# Patient Record
Sex: Female | Born: 1995 | Hispanic: Yes | Marital: Married | Smoking: Never smoker
Health system: Southern US, Community
[De-identification: ages and names within clinical notes are randomized; demographics above are authoritative.]

## PROBLEM LIST (undated history)

## (undated) DIAGNOSIS — F419 Anxiety disorder, unspecified: Secondary | ICD-10-CM

## (undated) DIAGNOSIS — Z789 Other specified health status: Secondary | ICD-10-CM

## (undated) DIAGNOSIS — J45909 Unspecified asthma, uncomplicated: Secondary | ICD-10-CM

## (undated) DIAGNOSIS — E079 Disorder of thyroid, unspecified: Secondary | ICD-10-CM

## (undated) HISTORY — PX: NO PAST SURGERIES: SHX2092

## (undated) HISTORY — PX: THYROIDECTOMY: SHX17

## (undated) HISTORY — PX: TONSILLECTOMY: SUR1361

---
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
## 2016-01-28 ENCOUNTER — Encounter (INDEPENDENT_AMBULATORY_CARE_PROVIDER_SITE_OTHER): Payer: Self-pay | Admitting: Pediatric Endocrinology

## 2016-01-28 ENCOUNTER — Ambulatory Visit (INDEPENDENT_AMBULATORY_CARE_PROVIDER_SITE_OTHER): Payer: Medicaid Other | Admitting: Pediatric Endocrinology

## 2016-01-28 VITALS — BP 122/63 | HR 68 | Wt 193.0 lb

## 2016-01-28 DIAGNOSIS — E890 Postprocedural hypothyroidism: Secondary | ICD-10-CM

## 2016-01-28 LAB — COMPREHENSIVE METABOLIC PANEL
ALBUMIN: 4.2 g/dL (ref 3.6–5.1)
ALK PHOS: 40 U/L — AB (ref 47–176)
ALT: 11 U/L (ref 5–32)
AST: 13 U/L (ref 12–32)
BUN: 16 mg/dL (ref 7–20)
CALCIUM: 9.3 mg/dL (ref 8.9–10.4)
CHLORIDE: 105 mmol/L (ref 98–110)
CO2: 28 mmol/L (ref 20–31)
Creat: 0.71 mg/dL (ref 0.50–1.00)
Glucose, Bld: 76 mg/dL (ref 70–99)
POTASSIUM: 4.4 mmol/L (ref 3.8–5.1)
Sodium: 140 mmol/L (ref 135–146)
TOTAL PROTEIN: 6.8 g/dL (ref 6.3–8.2)
Total Bilirubin: 0.2 mg/dL (ref 0.2–1.1)

## 2016-01-28 LAB — T4, FREE: Free T4: 1.2 ng/dL (ref 0.8–1.4)

## 2016-01-28 LAB — TSH: TSH: 4.43 mIU/L — ABNORMAL HIGH (ref 0.50–4.30)

## 2016-01-28 LAB — T3, FREE: T3, Free: 2.6 pg/mL — ABNORMAL LOW (ref 3.0–4.7)

## 2016-01-28 NOTE — Progress Notes (Signed)
Subjective:  Subjective  Patient Name: Bertice Risse Date of Birth: 11-14-1995  MRN: [id number]  Emmajean Ratledge  presents to the office today for follow up evaluation and management of her hyperthyroidism secondary to Graves' disease and her noncompliance with anti-thyroid medications.[name]  HISTORY OF PRESENT ILLNESS:   Jazzmin is a 20 y.o. Hispanic-American young lady.   Saidee was accompanied by her self   1. The patient's initial pediatric endocrine consultation occurred on 07/09/13. On 06/13/13 the patient visited [facility] for a routine exam. Dr. Sabino Dick noted a goiter. TFTS showed a TSH of < 0.008 and free T4 of 10.31. She had symptoms consistent with hyperthyroidism including fatigue, poor sleep quality, weight loss, and tremor. She was then referred to endocrinology for further evaluation and management.   She had her thyroid removed at [facility] on 02/18/15.   2. The patient's last [facility] visit was on 09/25/15. In the interim she has been generally healthy.   She spent the summer in [place]. It was very hot.   She feels that she is doing really well now. She has been taking her synthroid basically every day. She is working out regularly. She feels that her hair is finally growing in nicely and her skin is also more healthy. She feels good and she is even losing weight.   She is on Synthroid 1 tab daily.  She says that her pills are BLUE. Her last note says 175 mcg which is a lavender pill. Her med rec shows 100 mcg which is a yellow pill. She did no bring the bottle with her today. She thinks it is the 175 mcg tabs.   She is taking Tums and Calcitriol.    3. Constitutional.  She feels "better/great" She appears well today.  Energy: She reports that she has more energy. Sleep: The patient is sleeping better. - she says she is sleeping more hours but waking up earlier and with more energy Body temperature: The patient's body temperature seems to be normal overall. She thinks it is normal.   Weight: Weight is down somewhat from last visit.  Eyes: There are no significant problems with soreness, bulging, or limited range of eye movements. Wears glasses Neck: The patient is not aware of any problems relating to the anterior neck and thyroid bed. She has a scar which is healing well Heart: The patient feels that her heart rate is fairly normal now.  Gastrointestinal:  There are no significant complaints of excessive hunger, acid reflux, upset stomach, stomach aches or pains, or constipation. Musculoskeletal: Muscles are getting stronger.  Psychological: Moods have improved- No more mood swings GYN: periods more regular. LMP about 2 weeks ago.   PAST MEDICAL, FAMILY, AND SOCIAL HISTORY  Past Medical History:  Diagnosis Date  . Asthma   . Thyroid disease     Family History  Problem Relation Age of Onset  . Cancer Maternal Grandmother   . Hypertension Maternal Grandmother   . Diabetes Maternal Grandfather      Current Outpatient Prescriptions:  .  calcium carbonate (TUMS - DOSED IN MG ELEMENTAL CALCIUM) 500 MG chewable tablet, Chew 1 tablet by mouth daily. Reported on 09/25/2015, Disp: , Rfl:  .  Calcium Citrate (CALCITRATE PO), Take by mouth., Disp: , Rfl:  .  levothyroxine (SYNTHROID) 100 MCG tablet, Take 1 tablet (100 mcg total) by mouth daily., Disp: 30 tablet, Rfl: 11  Allergies as of 01/28/2016  . (No Known Allergies)     reports that she has never  smoked. She does not have any smokeless tobacco history on file. She reports that she does not drink alcohol. Pediatric History  Patient Guardian Status  . Mother:  Jeanine Luz   Other Topics Concern  . Not on file   Social History Narrative  . No narrative on file    1. 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 Activities: working out in the mornings.  3. Primary Care Provider: Melanie Crazier, NP Has an appointment with Dr. Lorrin Jackson in 2 weeks.   REVIEW OF SYSTEMS: There are no other  significant problems involving Kolina's other body systems.    Objective:  Objective  Vital Signs:  BP 122/63   Pulse 68   Wt 193 lb (87.5 kg)   No height on file for this encounter.  Ht Readings from Last 3 Encounters:  01/15/15 5' 1.26" (1.556 m) (12 %, Z= -1.16)*  12/20/14 5' 1.54" (1.563 m) (15 %, Z= -1.05)*  10/24/14 5' 1.5" (1.562 m) (14 %, Z= -1.06)*   * Growth percentiles are based on CDC 2-20 Years data.   Wt Readings from Last 3 Encounters:  01/28/16 193 lb (87.5 kg) (97 %, Z= 1.85)*  09/25/15 195 lb 3.2 oz (88.5 kg) (97 %, Z= 1.90)*  03/20/15 185 lb (83.9 kg) (96 %, Z= 1.76)*   * Growth percentiles are based on CDC 2-20 Years data.   HC Readings from Last 3 Encounters:  No data found for [facility]   There is no height or weight on file to calculate BSA. No height on file for this encounter. 97 %ile (Z= 1.85) based on CDC 2-20 Years weight-for-age data using vitals from 01/28/2016.    PHYSICAL EXAM:  Constitutional: The patient appears healthy, but obese. She is calm and not anxious today. Head: The head is normocephalic. Face: The face appears normal. There are no obvious dysmorphic features. Eyes: The eyes appear to be normally formed and spaced. Gaze is conjugate.  There is no obvious arcus or proptosis. Moisture appears normal.  Ears: The ears are normally placed and appear externally normal. Mouth: The oropharynx and tongue appear normal. No more tongue tremor. Dentition appears to be normal for age. Oral moisture is normal.  Neck: Surgical scar noted. She has trace acanthosis nigricans posteriorly. Lungs: The lungs are clear to auscultation. Air movement is good. Heart: Heart rate and rhythm are regular. Heart sounds S1 and S2 are normal. Abdomen: The abdomen is enlarged. Bowel sounds are normal. There is no obvious hepatomegaly, splenomegaly, or other mass effect.  Arms: Muscle size and bulk are normal for age. Hands: Phalangeal and metacarpophalangeal joints  are normal. Palmar muscles are normal for age. No tremor Legs: Muscles appear normal for age. No edema is present..  Neurologic: Strength is below normal for age in both the upper and lower extremities. Muscle tone is normal. Sensation to touch is normal in both legs.    LAB DATA:   pending  IMAGING:  Thyroid US 06/26/14: Diffuse thyromegaly with heterogeneity, but no focal nodules   Assessment and Plan:  Assessment  ASSESSMENT: Bethan is a 20 y.o. Hispanic young woman who had thyroidectoy on 02/18/15 for management of uncontrolled Grave's Disease.   She had an interval of poor medication compliance both before and after thyroidectomy. She currently reports good medication compliance and is clinically euthyroid. Will recheck levels today.  She is unsure what her Synthroid dose is and it appears that it is wrong in the Epic EMR. Will have her check  her bottle at home and send message via MyChart with the correct dose.   She also continues on Calcitriol and Calcium supplements.   PLAN:  1. Diagnostic: TFTs, BMP, vit D level today.  2. Therapeutic:Continue Synthroid 175 (per patient recall this is dose from PCP). Continue Vit D and calcium 3. Patient education: Discussed changes since her surgery. Discussed issues with calcium. She is very pleased with the progress that she has made as she has become more consistent in her medication dosing.   4. Follow-up: Return in about 3 months (around 04/29/2016).      Level of Service: This visit lasted in excess of 25  minutes. More than 50% of the visit was devoted to counseling.   Cammie SickleBADIK, Jeffree Cazeau [name], MD

## 2016-01-28 NOTE — Patient Instructions (Signed)
Labs today  I will result your labs via Mychart- when you get the results- message me back with what dose you are taking of each of your medications and I can order your refills.

## 2016-01-29 ENCOUNTER — Encounter (HOSPITAL_COMMUNITY): Payer: Self-pay | Admitting: *Deleted

## 2016-01-29 LAB — VITAMIN D 25 HYDROXY (VIT D DEFICIENCY, FRACTURES): VIT D 25 HYDROXY: 17 ng/mL — AB (ref 30–100)

## 2016-04-30 ENCOUNTER — Ambulatory Visit (INDEPENDENT_AMBULATORY_CARE_PROVIDER_SITE_OTHER): Payer: Medicaid Other | Admitting: Pediatric Endocrinology

## 2016-04-30 ENCOUNTER — Encounter (INDEPENDENT_AMBULATORY_CARE_PROVIDER_SITE_OTHER): Payer: Self-pay | Admitting: Pediatric Endocrinology

## 2016-04-30 VITALS — BP 118/60 | HR 80 | Ht 61.81 in | Wt 184.2 lb

## 2016-04-30 DIAGNOSIS — E559 Vitamin D deficiency, unspecified: Secondary | ICD-10-CM | POA: Diagnosis not present

## 2016-04-30 DIAGNOSIS — E892 Postprocedural hypoparathyroidism: Secondary | ICD-10-CM

## 2016-04-30 LAB — T4, FREE: Free T4: 2.3 ng/dL — ABNORMAL HIGH (ref 0.8–1.4)

## 2016-04-30 LAB — TSH: TSH: 0.11 m[IU]/L — AB (ref 0.50–4.30)

## 2016-04-30 MED ORDER — LEVOTHYROXINE SODIUM 175 MCG PO TABS: 175.0000 ug | ORAL_TABLET | Freq: Every day | ORAL | 11 refills | Status: DC

## 2016-04-30 NOTE — Progress Notes (Signed)
Subjective:  Subjective  Patient Name: Audrey Burch Date of Birth: 05/30/1995  MRN: [id number]  Audrey Burch  presents to the office today for follow up evaluation and management of her hyperthyroidism secondary to Graves' disease and her noncompliance with anti-thyroid medications.[name].  HISTORY OF PRESENT ILLNESS:   [name] is a 21 y.o. Hispanic-American young lady.   [name] was accompanied by her self   1. The patient's initial pediatric endocrine consultation occurred on 07/09/13. On 06/13/13 the patient visited [facility] for a routine exam. Dr. Sabino Dickoccaro noted a goiter. TFTS showed a TSH of < 0.008 and free T4 of 10.31. She had symptoms consistent with hyperthyroidism including fatigue, poor sleep quality, weight loss, and tremor. She was then referred to endocrinology for further evaluation and management.   [facility].   2. The patient's last [facility] visit was on 01/28/16. In the interim she has been generally healthy.   She has continued to do well since last visit.   She has been taking her Synthroid every day. It is 175 mg. She brought the bottle with her today.   She has been taking Tums- twice daily.   She is not taking any Vit d.   She has been drinking a lot more water and has not been as hungry. She has decreased her portion size. She was working out regularly- she had to take a break because her mother was ill- but now she is about to start back in a cardio class.  She feels that her hair is growing in and filling in the spots where she had hair loss. She has had weight loss. She feels that she is sleeping better and her energy level is "pretty high".   3. Constitutional.  She feels "great" She appears well today.  Energy: She reports that she has more energy. Sleep: The patient is sleeping better.  Body temperature: The patient's body temperature seems to be normal overall. She thinks it is normal.  Weight: Weight is down from last  visit.  Eyes: There are no significant problems with soreness, bulging, or limited range of eye movements. Wears glasses Neck: The patient is not aware of any problems relating to the anterior neck and thyroid bed. She has a scar which is healing well Heart: The patient feels that her heart rate is fairly normal now.  Gastrointestinal:  There are no significant complaints of excessive hunger, acid reflux, upset stomach, stomach aches or pains, or constipation. Musculoskeletal: Muscles are getting stronger.  Psychological: Moods have improved- No more mood swings GYN: periods more irregular. Has not had a cycle in 2 months.   PAST MEDICAL, FAMILY, AND SOCIAL HISTORY  Past Medical History:  Diagnosis Date  . Asthma   . Thyroid disease     Family History  Problem Relation Age of Onset  . Cancer Maternal Grandmother   . Hypertension Maternal Grandmother   . Diabetes Maternal Grandfather      Current Outpatient Prescriptions:  .  calcium carbonate (TUMS - DOSED IN MG ELEMENTAL CALCIUM) 500 MG chewable tablet, Chew 1 tablet by mouth daily. Reported on 09/25/2015, Disp: , Rfl:  .  levothyroxine (SYNTHROID, LEVOTHROID) 175 MCG tablet, Take 1 tablet (175 mcg total) by mouth daily., Disp: 30 tablet, Rfl: 11 .  amoxicillin (AMOXIL) 875 MG tablet, Take 1 tablet (875 mg total) by mouth 2 (two) times daily. (Patient not taking: Reported on 04/30/2016), Disp: 14 tablet, Rfl: 0 .  Calcium Citrate (  CALCITRATE PO), Take by mouth., Disp: , Rfl:  .  ondansetron (ZOFRAN ODT) 4 MG disintegrating tablet, Take 1 tablet (4 mg total) by mouth every 8 (eight) hours as needed for nausea or vomiting. (Patient not taking: Reported on 04/30/2016), Disp: 6 tablet, Rfl: 0  Allergies as of 04/30/2016  . (No Known Allergies)     reports that she has never smoked. She does not have any smokeless tobacco history on file. She reports that she has current or past drug history. She reports that she does not drink  alcohol. Pediatric History  Patient Guardian Status  . Mother:  Oros Huerta,Sonia   Other Topics Concern  . Not on file   Social History Narrative   ** Merged History Encounter **        1. School and Family:  Helping mom- she wanted to have a reversal of her tubal ligation and they discovered that she had a tumor that was blocking and needed to remove- not cancer. She is working on getting approval for the surgery.  2. Activities: working out in the mornings.  3. Primary Care Provider: Melanie Crazier, NP   REVIEW OF SYSTEMS: There are no other significant problems involving Oneta's other body systems.    Objective:  Objective  Vital Signs  BP 118/60   Pulse 80   Ht 5' 1.81" (1.57 m)   Wt 184 lb 3.2 oz (83.6 kg)   BMI 33.90 kg/m   Blood pressure percentiles are 82.7 % systolic and 38.0 % diastolic based on NHBPEP's 4th Report.   Ht Readings from Last 3 Encounters:  04/30/16 5' 1.81" (1.57 m) (17 %, Z= -0.97)*  01/15/15 5' 1.26" (1.556 m) (12 %, Z= -1.16)*  12/20/14 5' 1.54" (1.563 m) (15 %, Z= -1.05)*   * Growth percentiles are based on CDC 2-20 Years data.   Wt Readings from Last 3 Encounters:  04/30/16 184 lb 3.2 oz (83.6 kg) (96 %, Z= 1.70)*  01/28/16 193 lb (87.5 kg) (97 %, Z= 1.85)*  09/25/15 195 lb 3.2 oz (88.5 kg) (97 %, Z= 1.90)*   * Growth percentiles are based on CDC 2-20 Years data.   HC Readings from Last 3 Encounters:  No data found for [facility]   Body surface area is 1.91 meters squared. 17 %ile (Z= -0.97) based on CDC 2-20 Years stature-for-age data using vitals from 04/30/2016. 96 %ile (Z= 1.70) based on CDC 2-20 Years weight-for-age data using vitals from 04/30/2016.    PHYSICAL EXAM:  Constitutional: The patient appears healthy. She is calm and not anxious today. She has had good weight loss since last visit.  Head: The head is normocephalic. Face: The face appears normal. There are no obvious dysmorphic features. Eyes: The eyes appear to be normally  formed and spaced. Gaze is conjugate.  There is no obvious arcus or proptosis. Moisture appears normal.  Ears: The ears are normally placed and appear externally normal. Mouth: The oropharynx and tongue appear normal. No more tongue tremor. Dentition appears to be normal for age. Oral moisture is normal.  Neck: Surgical scar noted. She has trace acanthosis nigricans posteriorly. Lungs: The lungs are clear to auscultation. Air movement is good. Heart: Heart rate and rhythm are regular. Heart sounds S1 and S2 are normal. Abdomen: The abdomen is enlarged. Bowel sounds are normal. There is no obvious hepatomegaly, splenomegaly, or other mass effect.  Arms: Muscle size and bulk are normal for age. Hands: Phalangeal and metacarpophalangeal joints are normal. Palmar muscles are  normal for age. No tremor Legs: Muscles appear normal for age. No edema is present..  Neurologic: Strength is below normal for age in both the upper and lower extremities. Muscle tone is normal. Sensation to touch is normal in both legs.    LAB DATA:   Pending   IMAGING:  Thyroid US 06/26/14: Diffuse thyromegaly with heterogeneity, but no focal nodules   Assessment and Plan:  Assessment  ASSESSMENT: Shaelin is a 21 y.o. Hispanic young woman who had thyroidectoy on 02/18/15 for management of uncontrolled Grave's Disease.   She had an interval of poor medication compliance both before and after thyroidectomy. She currently reports good medication compliance and is clinically euthyroid. Will recheck levels today.  She has done well with taking Calcium but has not been consistent with taking Vit d.   She thinks that her Medicaid was renewed but our records do not reflect this. Discussed the low cost prescriptions at [facility] and other pharmacies.   PLAN:  1. Diagnostic: TFTs only today.  2. Therapeutic:Continue Synthroid 175 and calcium. Start vit D 2000 -2500 IU/day.  3. Patient education: Discussed changes since her last  visit. She is very pleased with the progress that she has made as she has become more consistent in her medication dosing.  Reminded her about adding Vit D. Discussed cash pay and prescription costs if she does not currently have insurance.  4. Follow-up: Return in about 6 months (around 10/28/2016).      Level of Service: This visit lasted in excess of 25 minutes. More than 50% of the visit was devoted to counseling.   Dessa Phi, MD

## 2016-04-30 NOTE — Patient Instructions (Signed)
Labs today.   Start vit D 2000-2500 IU/day. [facility] makes a 2500 IU gummy called Berry-D-Licious.   Continue Synthroid 175 mcg. May increase dose depending on lab today.

## 2016-05-01 ENCOUNTER — Other Ambulatory Visit: Payer: Self-pay | Admitting: Pediatric Endocrinology

## 2016-05-01 DIAGNOSIS — E892 Postprocedural hypoparathyroidism: Secondary | ICD-10-CM

## 2016-05-01 MED ORDER — LEVOTHYROXINE SODIUM 150 MCG PO TABS: 150.0000 ug | ORAL_TABLET | Freq: Every day | ORAL | 11 refills | Status: DC

## 2016-05-11 ENCOUNTER — Other Ambulatory Visit (INDEPENDENT_AMBULATORY_CARE_PROVIDER_SITE_OTHER): Payer: Self-pay | Admitting: *Deleted

## 2016-05-11 DIAGNOSIS — E892 Postprocedural hypoparathyroidism: Secondary | ICD-10-CM

## 2016-05-11 MED ORDER — LEVOTHYROXINE SODIUM 150 MCG PO TABS: 150.0000 ug | ORAL_TABLET | Freq: Every day | ORAL | 11 refills | Status: DC

## 2016-11-03 ENCOUNTER — Ambulatory Visit (INDEPENDENT_AMBULATORY_CARE_PROVIDER_SITE_OTHER): Payer: Self-pay | Admitting: Pediatric Endocrinology

## 2016-12-04 ENCOUNTER — Emergency Department (HOSPITAL_COMMUNITY)
Admission: EM | Admit: 2016-12-04 | Discharge: 2016-12-05 | Disposition: A | Discharge status: Home / Self Care | Payer: No Typology Code available for payment source | Attending: Emergency Medicine | Admitting: Emergency Medicine

## 2016-12-04 ENCOUNTER — Emergency Department (HOSPITAL_COMMUNITY): Payer: No Typology Code available for payment source

## 2016-12-04 ENCOUNTER — Encounter (HOSPITAL_COMMUNITY): Payer: Self-pay | Admitting: *Deleted

## 2016-12-04 DIAGNOSIS — Z79899 Other long term (current) drug therapy: Secondary | ICD-10-CM | POA: Insufficient documentation

## 2016-12-04 DIAGNOSIS — Y939 Activity, unspecified: Secondary | ICD-10-CM | POA: Diagnosis not present

## 2016-12-04 DIAGNOSIS — Y999 Unspecified external cause status: Secondary | ICD-10-CM | POA: Insufficient documentation

## 2016-12-04 DIAGNOSIS — J45909 Unspecified asthma, uncomplicated: Secondary | ICD-10-CM | POA: Diagnosis not present

## 2016-12-04 DIAGNOSIS — S20219A Contusion of unspecified front wall of thorax, initial encounter: Secondary | ICD-10-CM | POA: Diagnosis not present

## 2016-12-04 DIAGNOSIS — S301XXA Contusion of abdominal wall, initial encounter: Secondary | ICD-10-CM | POA: Insufficient documentation

## 2016-12-04 DIAGNOSIS — Y929 Unspecified place or not applicable: Secondary | ICD-10-CM | POA: Diagnosis not present

## 2016-12-04 DIAGNOSIS — R0789 Other chest pain: Secondary | ICD-10-CM | POA: Insufficient documentation

## 2016-12-04 DIAGNOSIS — Z041 Encounter for examination and observation following transport accident: Secondary | ICD-10-CM | POA: Diagnosis present

## 2016-12-04 LAB — I-STAT CHEM 8, ED
BUN: 6 mg/dL (ref 6–20)
CHLORIDE: 101 mmol/L (ref 101–111)
CREATININE: 0.5 mg/dL (ref 0.44–1.00)
Calcium, Ion: 1.1 mmol/L — ABNORMAL LOW (ref 1.15–1.40)
GLUCOSE: 102 mg/dL — AB (ref 65–99)
HCT: 39 % (ref 36.0–46.0)
Hemoglobin: 13.3 g/dL (ref 12.0–15.0)
POTASSIUM: 3.7 mmol/L (ref 3.5–5.1)
Sodium: 140 mmol/L (ref 135–145)
TCO2: 25 mmol/L (ref 0–100)

## 2016-12-04 LAB — SAMPLE TO BLOOD BANK

## 2016-12-04 LAB — I-STAT BETA HCG BLOOD, ED (MC, WL, AP ONLY): I-stat hCG, quantitative: 15.9 m[IU]/mL — ABNORMAL HIGH (ref <5)

## 2016-12-04 LAB — POC URINE PREG, ED: PREG TEST UR: NEGATIVE

## 2016-12-04 MED ORDER — SODIUM CHLORIDE 0.9 % IV BOLUS (SEPSIS)
1000.0000 mL | Freq: Once | INTRAVENOUS | Status: AC
  Administered 2016-12-04: 1000 mL via INTRAVENOUS

## 2016-12-04 MED ORDER — MORPHINE SULFATE (PF) 4 MG/ML IV SOLN
4.0000 mg | Freq: Once | INTRAVENOUS | Status: AC
  Administered 2016-12-04: 4 mg via INTRAVENOUS
  Filled 2016-12-04: qty 1

## 2016-12-04 MED ORDER — IOPAMIDOL (ISOVUE-300) INJECTION 61%
INTRAVENOUS | Status: AC
  Filled 2016-12-04: qty 100

## 2016-12-04 MED ORDER — IOPAMIDOL (ISOVUE-300) INJECTION 61%
100.0000 mL | Freq: Once | INTRAVENOUS | Status: AC | PRN
  Administered 2016-12-04: 100 mL via INTRAVENOUS

## 2016-12-04 MED ORDER — ONDANSETRON HCL 4 MG/2ML IJ SOLN
4.0000 mg | Freq: Once | INTRAMUSCULAR | Status: AC
  Administered 2016-12-04: 4 mg via INTRAVENOUS
  Filled 2016-12-04: qty 2
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
## 2016-12-04 NOTE — ED Notes (Signed)
Pt mentioned some vaginal bleeding after providing a urine specimen.
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
## 2016-12-05 MED ORDER — METHOCARBAMOL 500 MG PO TABS: 500.0000 mg | ORAL_TABLET | Freq: Two times a day (BID) | ORAL | 0 refills | Status: DC | PRN

## 2016-12-05 MED ORDER — OXYCODONE-ACETAMINOPHEN 5-325 MG PO TABS
2.0000 | ORAL_TABLET | Freq: Once | ORAL | Status: AC
  Administered 2016-12-05: 2 via ORAL
  Filled 2016-12-05: qty 2

## 2016-12-05 MED ORDER — IBUPROFEN 600 MG PO TABS: 600.0000 mg | ORAL_TABLET | Freq: Four times a day (QID) | ORAL | 0 refills | Status: DC | PRN

## 2016-12-05 MED ORDER — KETOROLAC TROMETHAMINE 30 MG/ML IJ SOLN
30.0000 mg | Freq: Once | INTRAMUSCULAR | Status: AC
  Administered 2016-12-05: 30 mg via INTRAVENOUS
  Filled 2016-12-05: qty 1

## 2016-12-05 MED ORDER — TRAMADOL HCL 50 MG PO TABS: 50.0000 mg | ORAL_TABLET | Freq: Four times a day (QID) | ORAL | 0 refills | Status: DC | PRN

## 2016-12-05 NOTE — Discharge Instructions (Signed)
Apply ice to areas of pain and injury to limit swelling 3-4 times per day. We recommend ibuprofen as prescribed for pain and inflammation. You may take Robaxin as needed for muscle spasms. If your pain continues, you may take tramadol as prescribed. Follow-up with your primary care doctor to ensure resolution of symptoms.

## 2016-12-05 NOTE — ED Notes (Signed)
PT DISCHARGED. INSTRUCTIONS AND PRESCRIPTIONS GIVEN. AAOX4. PT IN NO APPARENT DISTRESS WITH MODERATE PAIN. THE OPPORTUNITY TO ASK QUESTIONS WAS PROVIDED. 

## 2016-12-07 ENCOUNTER — Encounter (INDEPENDENT_AMBULATORY_CARE_PROVIDER_SITE_OTHER): Payer: Self-pay | Admitting: Pediatric Endocrinology

## 2016-12-09 DIAGNOSIS — R1084 Generalized abdominal pain: Secondary | ICD-10-CM | POA: Insufficient documentation

## 2016-12-09 DIAGNOSIS — Z79899 Other long term (current) drug therapy: Secondary | ICD-10-CM | POA: Insufficient documentation

## 2016-12-09 DIAGNOSIS — J45909 Unspecified asthma, uncomplicated: Secondary | ICD-10-CM | POA: Diagnosis not present

## 2016-12-09 DIAGNOSIS — R112 Nausea with vomiting, unspecified: Secondary | ICD-10-CM | POA: Diagnosis present

## 2016-12-09 DIAGNOSIS — R0789 Other chest pain: Secondary | ICD-10-CM | POA: Diagnosis not present

## 2016-12-09 DIAGNOSIS — N10 Acute tubulo-interstitial nephritis: Secondary | ICD-10-CM | POA: Insufficient documentation
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
## 2016-12-10 ENCOUNTER — Encounter (HOSPITAL_COMMUNITY): Payer: Self-pay

## 2016-12-10 ENCOUNTER — Emergency Department (HOSPITAL_COMMUNITY): Payer: No Typology Code available for payment source

## 2016-12-10 ENCOUNTER — Emergency Department (HOSPITAL_COMMUNITY)
Admission: EM | Admit: 2016-12-10 | Discharge: 2016-12-10 | Disposition: A | Discharge status: Home / Self Care | Payer: No Typology Code available for payment source | Attending: Emergency Medicine | Admitting: Emergency Medicine

## 2016-12-10 DIAGNOSIS — R1084 Generalized abdominal pain: Secondary | ICD-10-CM

## 2016-12-10 DIAGNOSIS — N12 Tubulo-interstitial nephritis, not specified as acute or chronic: Secondary | ICD-10-CM

## 2016-12-10 DIAGNOSIS — R112 Nausea with vomiting, unspecified: Secondary | ICD-10-CM

## 2016-12-10 LAB — URINALYSIS, ROUTINE W REFLEX MICROSCOPIC
Bilirubin Urine: NEGATIVE
Glucose, UA: NEGATIVE mg/dL
Ketones, ur: NEGATIVE mg/dL
Nitrite: NEGATIVE
PROTEIN: 30 mg/dL — AB
Specific Gravity, Urine: 1.005 (ref 1.005–1.030)
pH: 5 (ref 5.0–8.0)

## 2016-12-10 LAB — COMPREHENSIVE METABOLIC PANEL
ALT: 13 U/L — AB (ref 14–54)
AST: 14 U/L — ABNORMAL LOW (ref 15–41)
Albumin: 4.5 g/dL (ref 3.5–5.0)
Alkaline Phosphatase: 30 U/L — ABNORMAL LOW (ref 38–126)
Anion gap: 7 (ref 5–15)
BUN: 14 mg/dL (ref 6–20)
CHLORIDE: 105 mmol/L (ref 101–111)
CO2: 30 mmol/L (ref 22–32)
CREATININE: 1.53 mg/dL — AB (ref 0.44–1.00)
Calcium: 8.3 mg/dL — ABNORMAL LOW (ref 8.9–10.3)
GFR calc Af Amer: 56 mL/min — ABNORMAL LOW (ref >60)
GFR calc non Af Amer: 48 mL/min — ABNORMAL LOW (ref >60)
Glucose, Bld: 105 mg/dL — ABNORMAL HIGH (ref 65–99)
Potassium: 3.4 mmol/L — ABNORMAL LOW (ref 3.5–5.1)
Sodium: 142 mmol/L (ref 135–145)
Total Bilirubin: 0.5 mg/dL (ref 0.3–1.2)
Total Protein: 8.1 g/dL (ref 6.5–8.1)

## 2016-12-10 LAB — CBC WITH DIFFERENTIAL/PLATELET
Basophils Absolute: 0 10*3/uL (ref 0.0–0.1)
Basophils Relative: 0 %
EOS PCT: 1 %
Eosinophils Absolute: 0.1 10*3/uL (ref 0.0–0.7)
HCT: 37.4 % (ref 36.0–46.0)
HEMOGLOBIN: 11.9 g/dL — AB (ref 12.0–15.0)
Lymphocytes Relative: 23 %
Lymphs Abs: 3 10*3/uL (ref 0.7–4.0)
MCH: 24.5 pg — ABNORMAL LOW (ref 26.0–34.0)
MCHC: 31.8 g/dL (ref 30.0–36.0)
MCV: 77 fL — ABNORMAL LOW (ref 78.0–100.0)
Monocytes Absolute: 0.8 10*3/uL (ref 0.1–1.0)
Monocytes Relative: 6 %
NEUTROS PCT: 70 %
Neutro Abs: 9.2 10*3/uL — ABNORMAL HIGH (ref 1.7–7.7)
PLATELETS: 329 10*3/uL (ref 150–400)
RBC: 4.86 MIL/uL (ref 3.87–5.11)
RDW: 15.3 % (ref 11.5–15.5)
WBC: 13.2 10*3/uL — ABNORMAL HIGH (ref 4.0–10.5)

## 2016-12-10 LAB — PREGNANCY, URINE: PREG TEST UR: NEGATIVE

## 2016-12-10 LAB — I-STAT CG4 LACTIC ACID, ED: Lactic Acid, Venous: 0.79 mmol/L (ref 0.5–1.9)

## 2016-12-10 MED ORDER — SODIUM CHLORIDE 0.9 % IV BOLUS (SEPSIS)
1000.0000 mL | Freq: Once | INTRAVENOUS | Status: AC
  Administered 2016-12-10: 1000 mL via INTRAVENOUS

## 2016-12-10 MED ORDER — ONDANSETRON HCL 4 MG/2ML IJ SOLN
4.0000 mg | Freq: Once | INTRAMUSCULAR | Status: AC
  Administered 2016-12-10: 4 mg via INTRAVENOUS
  Filled 2016-12-10: qty 2

## 2016-12-10 MED ORDER — CEPHALEXIN 500 MG PO CAPS: 500.0000 mg | ORAL_CAPSULE | Freq: Two times a day (BID) | ORAL | 0 refills | Status: DC

## 2016-12-10 MED ORDER — IOPAMIDOL (ISOVUE-300) INJECTION 61%
100.0000 mL | Freq: Once | INTRAVENOUS | Status: AC | PRN
  Administered 2016-12-10: 80 mL via INTRAVENOUS

## 2016-12-10 MED ORDER — ONDANSETRON HCL 4 MG PO TABS: 4.0000 mg | ORAL_TABLET | Freq: Four times a day (QID) | ORAL | 0 refills | Status: DC

## 2016-12-10 MED ORDER — DEXTROSE 5 % IV SOLN
1.0000 g | Freq: Once | INTRAVENOUS | Status: AC
  Administered 2016-12-10: 1 g via INTRAVENOUS
  Filled 2016-12-10: qty 10

## 2016-12-10 MED ORDER — IOPAMIDOL (ISOVUE-300) INJECTION 61%
INTRAVENOUS | Status: AC
  Administered 2016-12-10: 80 mL via INTRAVENOUS
  Filled 2016-12-10: qty 100

## 2016-12-10 MED ORDER — HYDROCODONE-ACETAMINOPHEN 5-325 MG PO TABS: 1.0000 | ORAL_TABLET | Freq: Four times a day (QID) | ORAL | 0 refills | Status: DC | PRN

## 2016-12-10 MED ORDER — FENTANYL CITRATE (PF) 100 MCG/2ML IJ SOLN
50.0000 ug | Freq: Once | INTRAMUSCULAR | Status: AC
  Administered 2016-12-10: 50 ug via INTRAVENOUS
  Filled 2016-12-10: qty 2
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
## 2016-12-10 NOTE — ED Notes (Signed)
Gave report to Nikki, RN.

## 2016-12-10 NOTE — ED Notes (Signed)
Bed: WA21 Expected date:  Expected time:  Means of arrival:  Comments: 

## 2016-12-10 NOTE — Discharge Instructions (Signed)
If your symptoms worsen, please return to the ER.

## 2016-12-11 LAB — URINE CULTURE

## 2017-06-17 ENCOUNTER — Emergency Department (HOSPITAL_COMMUNITY)
Admission: EM | Admit: 2017-06-17 | Discharge: 2017-06-18 | Disposition: A | Discharge status: Home / Self Care | Payer: Self-pay | Attending: Emergency Medicine | Admitting: Emergency Medicine

## 2017-06-17 ENCOUNTER — Encounter (HOSPITAL_COMMUNITY): Payer: Self-pay | Admitting: *Deleted

## 2017-06-17 DIAGNOSIS — R51 Headache: Secondary | ICD-10-CM | POA: Insufficient documentation

## 2017-06-17 DIAGNOSIS — Z5321 Procedure and treatment not carried out due to patient leaving prior to being seen by health care provider: Secondary | ICD-10-CM | POA: Insufficient documentation

## 2017-06-17 NOTE — ED Notes (Signed)
After triage, the patient states she "may be pregnant, I am unsure"

## 2017-06-17 NOTE — ED Triage Notes (Signed)
Pt reports onset of headache this morning when she woke up. She has had nausea, dizziness, and 1 episode of vomiting after taking ibuprofen.

## 2017-06-18 LAB — PREGNANCY, URINE: Preg Test, Ur: NEGATIVE

## 2017-06-18 NOTE — ED Notes (Signed)
Pt called to be taken to treatment room x 1 with no answer 

## 2017-07-29 ENCOUNTER — Other Ambulatory Visit: Payer: Self-pay | Admitting: Family Medicine

## 2017-07-29 DIAGNOSIS — R1011 Right upper quadrant pain: Secondary | ICD-10-CM

## 2017-09-03 ENCOUNTER — Encounter (HOSPITAL_COMMUNITY): Payer: Self-pay | Admitting: *Deleted

## 2017-09-03 ENCOUNTER — Other Ambulatory Visit: Payer: Self-pay

## 2017-09-03 ENCOUNTER — Inpatient Hospital Stay (HOSPITAL_COMMUNITY)
Admission: AD | Admit: 2017-09-03 | Discharge: 2017-09-03 | Disposition: A | Discharge status: Home / Self Care | Payer: Self-pay | Source: Ambulatory Visit | Attending: Obstetrics and Gynecology | Admitting: Obstetrics and Gynecology

## 2017-09-03 DIAGNOSIS — Z8249 Family history of ischemic heart disease and other diseases of the circulatory system: Secondary | ICD-10-CM | POA: Insufficient documentation

## 2017-09-03 DIAGNOSIS — Z809 Family history of malignant neoplasm, unspecified: Secondary | ICD-10-CM | POA: Insufficient documentation

## 2017-09-03 DIAGNOSIS — E890 Postprocedural hypothyroidism: Secondary | ICD-10-CM | POA: Insufficient documentation

## 2017-09-03 DIAGNOSIS — D500 Iron deficiency anemia secondary to blood loss (chronic): Secondary | ICD-10-CM

## 2017-09-03 DIAGNOSIS — Z833 Family history of diabetes mellitus: Secondary | ICD-10-CM | POA: Insufficient documentation

## 2017-09-03 DIAGNOSIS — N921 Excessive and frequent menstruation with irregular cycle: Secondary | ICD-10-CM

## 2017-09-03 DIAGNOSIS — D649 Anemia, unspecified: Secondary | ICD-10-CM | POA: Insufficient documentation

## 2017-09-03 DIAGNOSIS — N939 Abnormal uterine and vaginal bleeding, unspecified: Secondary | ICD-10-CM | POA: Insufficient documentation

## 2017-09-03 DIAGNOSIS — Z8709 Personal history of other diseases of the respiratory system: Secondary | ICD-10-CM | POA: Insufficient documentation

## 2017-09-03 DIAGNOSIS — Z79899 Other long term (current) drug therapy: Secondary | ICD-10-CM | POA: Insufficient documentation

## 2017-09-03 DIAGNOSIS — Z791 Long term (current) use of non-steroidal anti-inflammatories (NSAID): Secondary | ICD-10-CM | POA: Insufficient documentation

## 2017-09-03 DIAGNOSIS — T385X5A Adverse effect of other estrogens and progestogens, initial encounter: Secondary | ICD-10-CM

## 2017-09-03 DIAGNOSIS — Z79891 Long term (current) use of opiate analgesic: Secondary | ICD-10-CM | POA: Insufficient documentation

## 2017-09-03 LAB — URINALYSIS, ROUTINE W REFLEX MICROSCOPIC

## 2017-09-03 LAB — CBC
HEMATOCRIT: 32.7 % — AB (ref 36.0–46.0)
HEMOGLOBIN: 10.2 g/dL — AB (ref 12.0–15.0)
MCH: 24.4 pg — ABNORMAL LOW (ref 26.0–34.0)
MCHC: 31.2 g/dL (ref 30.0–36.0)
MCV: 78.2 fL (ref 78.0–100.0)
Platelets: 365 10*3/uL (ref 150–400)
RBC: 4.18 MIL/uL (ref 3.87–5.11)
RDW: 15.2 % (ref 11.5–15.5)
WBC: 13.4 10*3/uL — AB (ref 4.0–10.5)

## 2017-09-03 LAB — URINALYSIS, MICROSCOPIC (REFLEX): Bacteria, UA: NONE SEEN

## 2017-09-03 LAB — POCT PREGNANCY, URINE: Preg Test, Ur: NEGATIVE

## 2017-09-03 NOTE — MAU Note (Signed)
Got her first depo on 3/27. Is about to be her 4th wk on her cycle.  First few days was very light spotting,now iIs very very heavy, big glops are coming out.  Never had anything like this before.

## 2017-09-03 NOTE — Progress Notes (Signed)
D/c instructions given with pt understanding. Pt left unit ambulatory

## 2017-09-03 NOTE — Discharge Instructions (Signed)
Anemia Anemia is a condition in which you do not have enough red blood cells or hemoglobin. Hemoglobin is a substance in red blood cells that carries oxygen. When you do not have enough red blood cells or hemoglobin (are anemic), your body cannot get enough oxygen and your organs may not work properly. As a result, you may feel very tired or have other problems. What are the causes? Common causes of anemia include:  Excessive bleeding. Anemia can be caused by excessive bleeding inside or outside the body, including bleeding from the intestine or from periods in women.  Poor nutrition.  Long-lasting (chronic) kidney, thyroid, and liver disease.  Bone marrow disorders.  Cancer and treatments for cancer.  HIV (human immunodeficiency virus) and AIDS (acquired immunodeficiency syndrome).  Treatments for HIV and AIDS.  Spleen problems.  Blood disorders.  Infections, medicines, and autoimmune disorders that destroy red blood cells.  What are the signs or symptoms? Symptoms of this condition include:  Minor weakness.  Dizziness.  Headache.  Feeling heartbeats that are irregular or faster than normal (palpitations).  Shortness of breath, especially with exercise.  Paleness.  Cold sensitivity.  Indigestion.  Nausea.  Difficulty sleeping.  Difficulty concentrating.  Symptoms may occur suddenly or develop slowly. If your anemia is mild, you may not have symptoms. How is this diagnosed? This condition is diagnosed based on:  Blood tests.  Your medical history.  A physical exam.  Bone marrow biopsy.  Your health care provider may also check your stool (feces) for blood and may do additional testing to look for the cause of your bleeding. You may also have other tests, including:  Imaging tests, such as a CT scan or MRI.  Endoscopy.  Colonoscopy.  How is this treated? Treatment for this condition depends on the cause. If you continue to lose a lot of blood,  you may need to be treated at a hospital. Treatment may include:  Taking supplements of iron, vitamin T02, or folic acid.  Taking a hormone medicine (erythropoietin) that can help to stimulate red blood cell growth.  Having a blood transfusion. This may be needed if you lose a lot of blood.  Making changes to your diet.  Having surgery to remove your spleen.  Follow these instructions at home:  Take over-the-counter and prescription medicines only as told by your health care provider.  Take supplements only as told by your health care provider.  Follow any diet instructions that you were given.  Keep all follow-up visits as told by your health care provider. This is important. Contact a health care provider if:  You develop new bleeding anywhere in the body. Get help right away if:  You are very weak.  You are short of breath.  You have pain in your abdomen or chest.  You are dizzy or feel faint.  You have trouble concentrating.  You have bloody or black, tarry stools.  You vomit repeatedly or you vomit up blood. Summary  Anemia is a condition in which you do not have enough red blood cells or enough of a substance in your red blood cells that carries oxygen (hemoglobin).  Symptoms may occur suddenly or develop slowly.  If your anemia is mild, you may not have symptoms.  This condition is diagnosed with blood tests as well as a medical history and physical exam. Other tests may be needed.  Treatment for this condition depends on the cause of the anemia. This information is not intended to replace advice  given to you by your health care provider. Make sure you discuss any questions you have with your health care provider. Document Released: 05/14/2004 Document Revised: 05/08/2016 Document Reviewed: 05/08/2016 Elsevier Interactive Patient Education  2018 [employer]. Medroxyprogesterone injection [Contraceptive] What is this medicine? MEDROXYPROGESTERONE (me  DROX ee proe JES te rone) contraceptive injections prevent pregnancy. They provide effective birth control for 3 months. Depo-subQ Provera 104 is also used for treating pain related to endometriosis. This medicine may be used for other purposes; ask your health care provider or pharmacist if you have questions. COMMON BRAND NAME(S): Depo-Provera, Depo-subQ Provera 104 What should I tell my health care provider before I take this medicine? They need to know if you have any of these conditions: -frequently drink alcohol -asthma -blood vessel disease or a history of a blood clot in the lungs or legs -bone disease such as osteoporosis -breast cancer -diabetes -eating disorder (anorexia nervosa or bulimia) -high blood pressure -HIV infection or AIDS -kidney disease -liver disease -mental depression -migraine -seizures (convulsions) -stroke -tobacco smoker -vaginal bleeding -an unusual or allergic reaction to medroxyprogesterone, other hormones, medicines, foods, dyes, or preservatives -pregnant or trying to get pregnant -breast-feeding How should I use this medicine? Depo-Provera Contraceptive injection is given into a muscle. Depo-subQ Provera 104 injection is given under the skin. These injections are given by a health care professional. You must not be pregnant before getting an injection. The injection is usually given during the first 5 days after the start of a menstrual period or 6 weeks after delivery of a baby. Talk to your pediatrician regarding the use of this medicine in children. Special care may be needed. These injections have been used in female children who have started having menstrual periods. Overdosage: If you think you have taken too much of this medicine contact a poison control center or emergency room at once. NOTE: This medicine is only for you. Do not share this medicine with others. What if I miss a dose? Try not to miss a dose. You must get an injection once  every 3 months to maintain birth control. If you cannot keep an appointment, call and reschedule it. If you wait longer than 13 weeks between Depo-Provera contraceptive injections or longer than 14 weeks between Depo-subQ Provera 104 injections, you could get pregnant. Use another method for birth control if you miss your appointment. You may also need a pregnancy test before receiving another injection. What may interact with this medicine? Do not take this medicine with any of the following medications: -bosentan This medicine may also interact with the following medications: -aminoglutethimide -antibiotics or medicines for infections, especially rifampin, rifabutin, rifapentine, and griseofulvin -aprepitant -barbiturate medicines such as phenobarbital or primidone -bexarotene -carbamazepine -medicines for seizures like ethotoin, felbamate, oxcarbazepine, phenytoin, topiramate -modafinil -St. John's wort This list may not describe all possible interactions. Give your health care provider a list of all the medicines, herbs, non-prescription drugs, or dietary supplements you use. Also tell them if you smoke, drink alcohol, or use illegal drugs. Some items may interact with your medicine. What should I watch for while using this medicine? This drug does not protect you against HIV infection (AIDS) or other sexually transmitted diseases. Use of this product may cause you to lose calcium from your bones. Loss of calcium may cause weak bones (osteoporosis). Only use this product for more than 2 years if other forms of birth control are not right for you. The longer you use this product for birth  control the more likely you will be at risk for weak bones. Ask your health care professional how you can keep strong bones. You may have a change in bleeding pattern or irregular periods. Many females stop having periods while taking this drug. If you have received your injections on time, your chance of  being pregnant is very low. If you think you may be pregnant, see your health care professional as soon as possible. Tell your health care professional if you want to get pregnant within the next year. The effect of this medicine may last a long time after you get your last injection. What side effects may I notice from receiving this medicine? Side effects that you should report to your doctor or health care professional as soon as possible: -allergic reactions like skin rash, itching or hives, swelling of the face, lips, or tongue -breast tenderness or discharge -breathing problems -changes in vision -depression -feeling faint or lightheaded, falls -fever -pain in the abdomen, chest, groin, or leg -problems with balance, talking, walking -unusually weak or tired -yellowing of the eyes or skin Side effects that usually do not require medical attention (report to your doctor or health care professional if they continue or are bothersome): -acne -fluid retention and swelling -headache -irregular periods, spotting, or absent periods -temporary pain, itching, or skin reaction at site where injected -weight gain This list may not describe all possible side effects. Call your doctor for medical advice about side effects. You may report side effects to FDA at 1-800-FDA-1088. Where should I keep my medicine? This does not apply. The injection will be given to you by a health care professional. NOTE: This sheet is a summary. It may not cover all possible information. If you have questions about this medicine, talk to your doctor, pharmacist, or health care provider.  2018 Elsevier/Gold Standard (2008-04-27 18:37:56)

## 2017-09-03 NOTE — MAU Provider Note (Signed)
History     CSN: [id number]  Arrival date and time: 09/03/17 1622   None     Chief Complaint  Patient presents with  . Vaginal Bleeding   HPI Audrey Burch is 22 y.o. G0P0000 presents for vaginal bleeding that began 3 weeks ago. States she choose Depo for contraception because she forgets daily medications. First Depo injection was given on 3/27 at [facility].  Began with very light spotting X 1 week and then increased and now heavy with clots X 2 weeks. Sexually active with 1 partner only X 1 yr.  She reports neg STI results at [facility] at the time Depo was given.     Past Medical History:  Diagnosis Date  . Asthma   . Thyroid disease     Past Surgical History:  Procedure Laterality Date  . THYROIDECTOMY      Family History  Problem Relation Age of Onset  . Cancer Maternal Grandmother   . Hypertension Maternal Grandmother   . Diabetes Maternal Grandfather     Social History   Tobacco Use  . Smoking status: Never Smoker  . Smokeless tobacco: Never Used  Substance Use Topics  . Alcohol use: No  . Drug use: No    Allergies: No Known Allergies  Medications Prior to Admission  Medication Sig Dispense Refill Last Dose  . calcium carbonate (OS-CAL) 1250 (500 Ca) MG chewable tablet Chew 1 tablet by mouth daily.   12/09/2016 at Unknown time  . cephALEXin (KEFLEX) 500 MG capsule Take 1 capsule (500 mg total) by mouth 2 (two) times daily. 28 capsule 0   . HYDROcodone-acetaminophen (NORCO/VICODIN) 5-325 MG tablet Take 1 tablet by mouth every 6 (six) hours as needed. 12 tablet 0   . ibuprofen (ADVIL,MOTRIN) 600 MG tablet Take 1 tablet (600 mg total) by mouth every 6 (six) hours as needed. (Patient taking differently: Take 600 mg by mouth every 6 (six) hours as needed for moderate pain. ) 30 tablet 0 Past Week at Unknown time  . levothyroxine (SYNTHROID, LEVOTHROID) 150 MCG tablet Take 1 tablet (150 mcg total) by mouth daily. 30 tablet 11 12/09/2016 at Unknown time  .  methocarbamol (ROBAXIN) 500 MG tablet Take 1 tablet (500 mg total) by mouth every 12 (twelve) hours as needed for muscle spasms. 14 tablet 0 Past Week at Unknown time  . ondansetron (ZOFRAN) 4 MG tablet Take 1 tablet (4 mg total) by mouth every 6 (six) hours. 12 tablet 0   . vitamin B-12 (CYANOCOBALAMIN) 100 MCG tablet Take 100 mcg by mouth daily.   12/09/2016 at Unknown time    Review of Systems  Constitutional: Negative for activity change, appetite change and fever.  Respiratory: Negative for shortness of breath.   Cardiovascular: Negative for chest pain.  Gastrointestinal: Negative for abdominal pain, nausea and vomiting.  Genitourinary: Positive for frequency and vaginal bleeding. Negative for dysuria, pelvic pain, urgency, vaginal discharge and vaginal pain.   Physical Exam   Blood pressure 120/67, pulse 90, temperature 98.6 F (37 C), resp. rate 18, weight 208 lb 8 oz (94.6 kg), last menstrual period 08/16/2017, SpO2 100 %.  Physical Exam  Nursing note and vitals reviewed. Constitutional: She is oriented to person, place, and time. She appears well-developed and well-nourished. No distress.  HENT:  Head: Normocephalic.  Neck: Normal range of motion.  Cardiovascular: Normal rate.  Respiratory: Effort normal.  GI: Soft. She exhibits no distension and no mass. There is no tenderness. There is no rebound and no  guarding.  Genitourinary: There is no rash, tenderness or lesion on the right labia. There is no rash, tenderness or lesion on the left labia. Uterus is not enlarged and not tender. Cervix exhibits no discharge. Right adnexum displays no mass, no tenderness and no fullness. Left adnexum displays no mass, no tenderness and no fullness. There is bleeding (small amount of thin, bright red bleeding without clots or abnormal vaginal odor.) in the vagina. No erythema or tenderness in the vagina. No vaginal discharge found.  Neurological: She is alert and oriented to person, place, and  time.  Skin: Skin is warm and dry.  Psychiatric: She has a normal mood and affect. Her behavior is normal. Thought content normal.   Results for orders placed or performed during the hospital encounter of 09/03/17 (from the past 24 hour(s))  Urinalysis, Routine w reflex microscopic     Status: Abnormal   Collection Time: 09/03/17  4:49 PM  Result Value Ref Range   Color, Urine RED (A) YELLOW   APPearance TURBID (A) CLEAR   Specific Gravity, Urine  1.005 - 1.030    TEST NOT REPORTED DUE TO COLOR INTERFERENCE OF URINE PIGMENT   pH  5.0 - 8.0    TEST NOT REPORTED DUE TO COLOR INTERFERENCE OF URINE PIGMENT   Glucose, UA (A) NEGATIVE mg/dL    TEST NOT REPORTED DUE TO COLOR INTERFERENCE OF URINE PIGMENT   Hgb urine dipstick (A) NEGATIVE    TEST NOT REPORTED DUE TO COLOR INTERFERENCE OF URINE PIGMENT   Bilirubin Urine (A) NEGATIVE    TEST NOT REPORTED DUE TO COLOR INTERFERENCE OF URINE PIGMENT   Ketones, ur (A) NEGATIVE mg/dL    TEST NOT REPORTED DUE TO COLOR INTERFERENCE OF URINE PIGMENT   Protein, ur (A) NEGATIVE mg/dL    TEST NOT REPORTED DUE TO COLOR INTERFERENCE OF URINE PIGMENT   Nitrite (A) NEGATIVE    TEST NOT REPORTED DUE TO COLOR INTERFERENCE OF URINE PIGMENT   Leukocytes, UA (A) NEGATIVE    TEST NOT REPORTED DUE TO COLOR INTERFERENCE OF URINE PIGMENT  Urinalysis, Microscopic (reflex)     Status: None   Collection Time: 09/03/17  4:49 PM  Result Value Ref Range   RBC / HPF >50 0 - 5 RBC/hpf   WBC, UA 0-5 0 - 5 WBC/hpf   Bacteria, UA NONE SEEN NONE SEEN   Squamous Epithelial / LPF 0-5 0 - 5  Pregnancy, urine POC     Status: None   Collection Time: 09/03/17  5:00 PM  Result Value Ref Range   Preg Test, Ur NEGATIVE NEGATIVE  CBC     Status: Abnormal   Collection Time: 09/03/17  5:20 PM  Result Value Ref Range   WBC 13.4 (H) 4.0 - 10.5 K/uL   RBC 4.18 3.87 - 5.11 MIL/uL   Hemoglobin 10.2 (L) 12.0 - 15.0 g/dL   HCT 40.9 (L) 81.1 - 91.4 %   MCV 78.2 78.0 - 100.0 fL   MCH  24.4 (L) 26.0 - 34.0 pg   MCHC 31.2 30.0 - 36.0 g/dL   RDW 78.2 95.6 - 21.3 %   Platelets 365 150 - 400 K/uL     MAU Course  Procedures  MDM MSE Exam Labs Discussed findings with patient.  Assessment and Plan  A:  Vaginal bleeding       Recent initial injection of Depo Provera     Anemia  P:  Discussed exam findings and labs with the patient  Discussed using 1 pack of estradiol to control bleeding but the patient states she knows she will forget to take the pills daily.  With that in mind, bleeding may be prolonged.      She will begin OTC FE and take 1 po daily until bleeding stops     Discussed bleeding associated with Depo may be noted for several months.      She is stable at time of discharge.    Follow up with [facility] in 2 months for next Depo injection Eve M Key 09/03/2017, 5:02 PM

## 2017-11-12 ENCOUNTER — Encounter (HOSPITAL_COMMUNITY): Payer: Self-pay | Admitting: *Deleted

## 2017-11-12 ENCOUNTER — Inpatient Hospital Stay (HOSPITAL_COMMUNITY)
Admission: AD | Admit: 2017-11-12 | Discharge: 2017-11-12 | Disposition: A | Discharge status: Home / Self Care | Payer: Self-pay | Source: Ambulatory Visit | Attending: Obstetrics and Gynecology | Admitting: Obstetrics and Gynecology

## 2017-11-12 DIAGNOSIS — B373 Candidiasis of vulva and vagina: Secondary | ICD-10-CM | POA: Insufficient documentation

## 2017-11-12 DIAGNOSIS — N3090 Cystitis, unspecified without hematuria: Secondary | ICD-10-CM | POA: Insufficient documentation

## 2017-11-12 DIAGNOSIS — Z79899 Other long term (current) drug therapy: Secondary | ICD-10-CM | POA: Insufficient documentation

## 2017-11-12 DIAGNOSIS — E079 Disorder of thyroid, unspecified: Secondary | ICD-10-CM | POA: Insufficient documentation

## 2017-11-12 DIAGNOSIS — B9689 Other specified bacterial agents as the cause of diseases classified elsewhere: Secondary | ICD-10-CM | POA: Insufficient documentation

## 2017-11-12 DIAGNOSIS — J45909 Unspecified asthma, uncomplicated: Secondary | ICD-10-CM | POA: Insufficient documentation

## 2017-11-12 DIAGNOSIS — Z79890 Hormone replacement therapy (postmenopausal): Secondary | ICD-10-CM | POA: Insufficient documentation

## 2017-11-12 DIAGNOSIS — B3731 Acute candidiasis of vulva and vagina: Secondary | ICD-10-CM

## 2017-11-12 HISTORY — DX: Other specified health status: Z78.9

## 2017-11-12 LAB — URINALYSIS, ROUTINE W REFLEX MICROSCOPIC
BILIRUBIN URINE: NEGATIVE
Bacteria, UA: NONE SEEN
GLUCOSE, UA: NEGATIVE mg/dL
Ketones, ur: NEGATIVE mg/dL
NITRITE: NEGATIVE
PH: 5 (ref 5.0–8.0)
Protein, ur: 30 mg/dL — AB
Specific Gravity, Urine: 1.029 (ref 1.005–1.030)

## 2017-11-12 LAB — WET PREP, GENITAL
CLUE CELLS WET PREP: NONE SEEN
SPERM: NONE SEEN
Trich, Wet Prep: NONE SEEN

## 2017-11-12 LAB — POCT PREGNANCY, URINE: Preg Test, Ur: NEGATIVE

## 2017-11-12 MED ORDER — SULFAMETHOXAZOLE-TRIMETHOPRIM 800-160 MG PO TABS: 1.0000 | ORAL_TABLET | Freq: Two times a day (BID) | ORAL | 0 refills | Status: DC

## 2017-11-12 MED ORDER — FLUCONAZOLE 150 MG PO TABS: 150.0000 mg | ORAL_TABLET | Freq: Once | ORAL | 0 refills | Status: AC

## 2017-11-12 MED ORDER — PHENAZOPYRIDINE HCL 100 MG PO TABS: 100.0000 mg | ORAL_TABLET | Freq: Three times a day (TID) | ORAL | 0 refills | Status: DC | PRN
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
## 2017-11-12 NOTE — MAU Note (Signed)
Pt reports she stopped bcp's a few days ago. States she has been very nauseated off/on for the last month. Also reports dysuria since yesterday.

## 2017-11-12 NOTE — Discharge Instructions (Signed)

## 2017-11-15 LAB — GC/CHLAMYDIA PROBE AMP (~~LOC~~) NOT AT ARMC
Chlamydia: NEGATIVE
NEISSERIA GONORRHEA: NEGATIVE

## 2018-03-10 ENCOUNTER — Emergency Department (HOSPITAL_COMMUNITY)
Admission: EM | Admit: 2018-03-10 | Discharge: 2018-03-10 | Disposition: A | Discharge status: Home / Self Care | Payer: Medicaid Other | Attending: Emergency Medicine | Admitting: Emergency Medicine

## 2018-03-10 ENCOUNTER — Encounter: Payer: Self-pay | Admitting: Emergency Medicine

## 2018-03-10 DIAGNOSIS — Z9114 Patient's other noncompliance with medication regimen: Secondary | ICD-10-CM | POA: Insufficient documentation

## 2018-03-10 DIAGNOSIS — E039 Hypothyroidism, unspecified: Secondary | ICD-10-CM | POA: Insufficient documentation

## 2018-03-10 DIAGNOSIS — R112 Nausea with vomiting, unspecified: Secondary | ICD-10-CM | POA: Insufficient documentation

## 2018-03-10 DIAGNOSIS — Z79899 Other long term (current) drug therapy: Secondary | ICD-10-CM | POA: Insufficient documentation

## 2018-03-10 DIAGNOSIS — J45909 Unspecified asthma, uncomplicated: Secondary | ICD-10-CM | POA: Insufficient documentation

## 2018-03-10 DIAGNOSIS — R42 Dizziness and giddiness: Secondary | ICD-10-CM | POA: Insufficient documentation

## 2018-03-10 LAB — I-STAT BETA HCG BLOOD, ED (MC, WL, AP ONLY): I-stat hCG, quantitative: 11.1 m[IU]/mL — ABNORMAL HIGH (ref <5)

## 2018-03-10 LAB — T4, FREE: Free T4: 1.04 ng/dL (ref 0.82–1.77)

## 2018-03-10 LAB — CBC WITH DIFFERENTIAL/PLATELET
Abs Immature Granulocytes: 0.1 10*3/uL — ABNORMAL HIGH (ref 0.00–0.07)
BASOS ABS: 0.1 10*3/uL (ref 0.0–0.1)
Basophils Relative: 0 %
EOS ABS: 0.2 10*3/uL (ref 0.0–0.5)
EOS PCT: 1 %
HCT: 39.7 % (ref 36.0–46.0)
Hemoglobin: 12.2 g/dL (ref 12.0–15.0)
IMMATURE GRANULOCYTES: 1 %
LYMPHS PCT: 30 %
Lymphs Abs: 3.9 10*3/uL (ref 0.7–4.0)
MCH: 24.8 pg — ABNORMAL LOW (ref 26.0–34.0)
MCHC: 30.7 g/dL (ref 30.0–36.0)
MCV: 80.7 fL (ref 80.0–100.0)
Monocytes Absolute: 0.7 10*3/uL (ref 0.1–1.0)
Monocytes Relative: 5 %
Neutro Abs: 8 10*3/uL — ABNORMAL HIGH (ref 1.7–7.7)
Neutrophils Relative %: 63 %
PLATELETS: 360 10*3/uL (ref 150–400)
RBC: 4.92 MIL/uL (ref 3.87–5.11)
RDW: 15.6 % — AB (ref 11.5–15.5)
WBC: 12.9 10*3/uL — AB (ref 4.0–10.5)
nRBC: 0 % (ref 0.0–0.2)

## 2018-03-10 LAB — URINALYSIS, ROUTINE W REFLEX MICROSCOPIC
BILIRUBIN URINE: NEGATIVE
GLUCOSE, UA: NEGATIVE mg/dL
KETONES UR: NEGATIVE mg/dL
NITRITE: NEGATIVE
Protein, ur: NEGATIVE mg/dL
SPECIFIC GRAVITY, URINE: 1.005 (ref 1.005–1.030)
pH: 6 (ref 5.0–8.0)

## 2018-03-10 LAB — COMPREHENSIVE METABOLIC PANEL
ALT: 16 U/L (ref 0–44)
ANION GAP: 8 (ref 5–15)
AST: 16 U/L (ref 15–41)
Albumin: 3.9 g/dL (ref 3.5–5.0)
Alkaline Phosphatase: 33 U/L — ABNORMAL LOW (ref 38–126)
BUN: 6 mg/dL (ref 6–20)
CHLORIDE: 106 mmol/L (ref 98–111)
CO2: 25 mmol/L (ref 22–32)
CREATININE: 0.68 mg/dL (ref 0.44–1.00)
Calcium: 8.7 mg/dL — ABNORMAL LOW (ref 8.9–10.3)
Glucose, Bld: 96 mg/dL (ref 70–99)
Potassium: 3.4 mmol/L — ABNORMAL LOW (ref 3.5–5.1)
Sodium: 139 mmol/L (ref 135–145)
Total Bilirubin: 0.6 mg/dL (ref 0.3–1.2)
Total Protein: 7 g/dL (ref 6.5–8.1)

## 2018-03-10 LAB — TSH: TSH: 9.481 u[IU]/mL — ABNORMAL HIGH (ref 0.350–4.500)

## 2018-03-10 MED ORDER — MECLIZINE HCL 25 MG PO TABS: 25.0000 mg | ORAL_TABLET | Freq: Three times a day (TID) | ORAL | 0 refills | Status: AC

## 2018-03-10 MED ORDER — SODIUM CHLORIDE 0.9 % IV BOLUS
1000.0000 mL | Freq: Once | INTRAVENOUS | Status: AC
  Administered 2018-03-10: 1000 mL via INTRAVENOUS

## 2018-03-10 NOTE — Discharge Instructions (Signed)
As discussed, your evaluation today has been largely reassuring.  But, it is important that you monitor your condition carefully, and do not hesitate to return to the ED if you develop new, or concerning changes in your condition.  Otherwise, please follow-up with your physician for appropriate ongoing care.  If you do not have a regular primary care physician, please contact our affiliated Health Center to establish care with one physician. With your history of thyroid disease it is very important that you establish a relationship with one physician to monitor your condition going forward.

## 2018-03-10 NOTE — ED Provider Notes (Signed)
[facility] EMERGENCY DEPARTMENT Provider Note   CSN: [id number] Arrival date & time: 03/10/18  1643     History   Chief Complaint Chief Complaint  Patient presents with  . Dizziness    HPI Audrey Burch is a 22 y.o. female.  HPI  Presents with concern of dizziness, nausea, vomiting. She was well until about 3 hours ago. She was at work, when she suddenly felt dizzy, nauseous.  When she had one episode of vomiting, has had persistent nausea, but no additional vomiting since that time. She does, however, have persistent dizziness, with lightheadedness, but no complete syncope. She felt as though she had some difficulty understanding regular speech as well. Dizziness seems worse with activity, though she was able to drive here from work. No relief with anything in particular. She states that she is generally well, though she has a history of hypothyroidism secondary to thyroid surgery several years ago. Notably, the patient was without her medication for about 1 month, until the last few days.  Past Medical History:  Diagnosis Date  . Asthma   . Medical history non-contributory   . Thyroid disease     Patient Active Problem List   Diagnosis Date Noted  . Hypovitaminosis D 04/30/2016  . 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 Hypocalcemia 03/20/2015  . 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 Acanthosis nigricans, acquired 07/10/2014  . Dyspepsia 07/10/2014    Past Surgical History:  Procedure Laterality Date  . NO PAST SURGERIES    . THYROIDECTOMY    . TONSILLECTOMY       OB History    Gravida  0   Para  0   Term  0   Preterm  0   AB  0   Living  0     SAB  0   TAB  0   Ectopic  0   Multiple  0   Live Births  0            Home Medications    Prior to Admission medications   Medication Sig Start Date End Date Taking? Authorizing Provider  IRON PO Take 1 tablet by mouth  daily.    [provider]  levothyroxine (SYNTHROID, LEVOTHROID) 75 MCG tablet Take 75 mcg by mouth daily before breakfast.    [provider]  phenazopyridine (PYRIDIUM) 100 MG tablet Take 1 tablet (100 mg total) by mouth 3 (three) times daily as needed for pain. 11/12/17   Poe, Deirdre C, CNM  sulfamethoxazole-trimethoprim (BACTRIM DS) 800-160 MG tablet Take 1 tablet by mouth 2 (two) times daily. 11/12/17   Poe, Deirdre Salena Saner, CNM    Family History Family History  Problem Relation Age of Onset  . Cancer Maternal Grandmother   . Hypertension Maternal Grandmother   . Diabetes Maternal Grandfather     Social History Social History   Tobacco Use  . Smoking status: Never Smoker  . Smokeless tobacco: Never Used  Substance Use Topics  . Alcohol use: No  . Drug use: No     Allergies   Patient has no known allergies.   Review of Systems Review of Systems  Constitutional:       Per HPI, otherwise negative  HENT:       Per HPI, otherwise negative  Respiratory:       Per HPI, otherwise negative  Cardiovascular:       Per HPI, otherwise negative  Gastrointestinal: Positive for nausea and  vomiting. Negative for abdominal pain.  Endocrine:       Negative aside from HPI  Genitourinary:       Neg aside from HPI   Musculoskeletal:       Per HPI, otherwise negative  Skin: Negative.   Neurological: Positive for dizziness and light-headedness. Negative for syncope.     Physical Exam Updated Vital Signs BP 132/68 (BP Location: Right Arm)   Pulse 74   Temp 98.2 F (36.8 C) (Oral)   Resp 13   Ht 5\' 1"  (1.549 m)   Wt 95.3 kg   SpO2 100%   BMI 39.68 kg/m   Physical Exam  Constitutional: She is oriented to person, place, and time. She appears well-developed and well-nourished. No distress.  HENT:  Head: Normocephalic and atraumatic.  Eyes: Conjunctivae and EOM are normal.  Cardiovascular: Normal rate and regular rhythm.  Pulmonary/Chest: Effort normal and  breath sounds normal. No stridor. No respiratory distress.  Abdominal: She exhibits no distension. There is no tenderness. There is no guarding.  Musculoskeletal: She exhibits no edema.  Neurological: She is alert and oriented to person, place, and time. She exhibits normal muscle tone.  Some subjective strength discrepancy, though no objective findings, strength is appropriate bilaterally, speech is clear, brief, appropriate. No facial asymmetry.   Skin: Skin is warm and dry.  Psychiatric: She has a normal mood and affect.  Nursing note and vitals reviewed.    ED Treatments / Results  Labs (all labs ordered are listed, but only abnormal results are displayed) Labs Reviewed  COMPREHENSIVE METABOLIC PANEL - Abnormal; Notable for the following components:      Result Value   Potassium 3.4 (*)    Calcium 8.7 (*)    Alkaline Phosphatase 33 (*)    All other components within normal limits  CBC WITH DIFFERENTIAL/PLATELET - Abnormal; Notable for the following components:   WBC 12.9 (*)    MCH 24.8 (*)    RDW 15.6 (*)    Neutro Abs 8.0 (*)    Abs Immature Granulocytes 0.10 (*)    All other components within normal limits  URINALYSIS, ROUTINE W REFLEX MICROSCOPIC - Abnormal; Notable for the following components:   APPearance HAZY (*)    Hgb urine dipstick LARGE (*)    Leukocytes, UA MODERATE (*)    Bacteria, UA FEW (*)    All other components within normal limits  TSH - Abnormal; Notable for the following components:   TSH 9.481 (*)    All other components within normal limits  I-STAT BETA HCG BLOOD, ED ([facility], [facility], [facility] ONLY) - Abnormal; Notable for the following components:   I-stat hCG, quantitative 11.1 (*)    All other components within normal limits  T4, FREE    EKG EKG Interpretation  Date/Time:  Thursday March 10 2018 16:53:46 EST Ventricular Rate:  76 PR Interval:    QRS Duration: 96 QT Interval:  390 QTC Calculation: 439 R Axis:   77 Text Interpretation:  Sinus  rhythm Borderline short PR interval Borderline T abnormalities, inferior leads Baseline wander in lead(s) II III aVF Abnormal ekg Confirmed by Gerhard MunchLockwood, Lanai Conlee [phone]([id number]) on 03/10/2018 5:09:10 PM   Procedures Procedures (including critical care time)  Medications Ordered in ED Medications  sodium chloride 0.9 % bolus 1,000 mL (0 mLs Intravenous Stopped 03/10/18 1907)     Initial Impression / Assessment and Plan / ED Course  I have reviewed the triage vital signs and the nursing notes.  Pertinent labs &  imaging results that were available during my care of the patient were reviewed by me and considered in my medical decision making (see chart for details).     7:24 PM Patient in no distress, awake, alert, moving her head without apparent limitation. When informed of her generally reassuring results, her first response was" I am not pregnant?"  She was informed of all results, including elevated TSH, there is some suspicion for the patient's recent resumption of her thyroid medication contributing to her episode of dizziness No evidence for acute new pathology, with no focal neurologic deficiencies, no distress, no hemodynamic instability. Patient may also be suffering from vertigo. Patient appropriate for discharge with ongoing medication for dizziness, will follow up with primary care and/or endocrinology.   Final Clinical Impressions(s) / ED Diagnoses  Dizziness   Gerhard Munch, MD 03/10/18 1925

## 2018-03-10 NOTE — ED Triage Notes (Signed)
Pt states she was at work and started feeling dizzy/lightheaded, nauseous, weak suddenly. Pt states she was talking to a coworker and forgot mid sentence what she was saying. Pt reports her BP at work was 152/90. Pt states she felt like she was going to pass out. Pt also states she feels SOB.

## 2018-06-13 ENCOUNTER — Encounter (HOSPITAL_COMMUNITY): Payer: Self-pay | Admitting: Emergency Medicine

## 2018-06-13 ENCOUNTER — Other Ambulatory Visit: Payer: Self-pay

## 2018-06-13 ENCOUNTER — Emergency Department (HOSPITAL_COMMUNITY): Payer: Self-pay

## 2018-06-13 ENCOUNTER — Emergency Department (HOSPITAL_COMMUNITY)
Admission: EM | Admit: 2018-06-13 | Discharge: 2018-06-14 | Disposition: A | Discharge status: Home / Self Care | Payer: Self-pay | Attending: Emergency Medicine | Admitting: Emergency Medicine

## 2018-06-13 DIAGNOSIS — Z79899 Other long term (current) drug therapy: Secondary | ICD-10-CM | POA: Insufficient documentation

## 2018-06-13 DIAGNOSIS — L03115 Cellulitis of right lower limb: Secondary | ICD-10-CM | POA: Insufficient documentation

## 2018-06-13 DIAGNOSIS — R739 Hyperglycemia, unspecified: Secondary | ICD-10-CM | POA: Insufficient documentation

## 2018-06-13 DIAGNOSIS — B353 Tinea pedis: Secondary | ICD-10-CM

## 2018-06-13 DIAGNOSIS — J45909 Unspecified asthma, uncomplicated: Secondary | ICD-10-CM | POA: Insufficient documentation

## 2018-06-13 LAB — LACTIC ACID, PLASMA: Lactic Acid, Venous: 1.8 mmol/L (ref 0.5–1.9)

## 2018-06-13 LAB — I-STAT BETA HCG BLOOD, ED (MC, WL, AP ONLY): I-stat hCG, quantitative: <5 m[IU]/mL (ref <5)

## 2018-06-13 LAB — COMPREHENSIVE METABOLIC PANEL
ALBUMIN: 4.1 g/dL (ref 3.5–5.0)
ALT: 17 U/L (ref 0–44)
AST: 20 U/L (ref 15–41)
Alkaline Phosphatase: 35 U/L — ABNORMAL LOW (ref 38–126)
Anion gap: 9 (ref 5–15)
BUN: 8 mg/dL (ref 6–20)
CO2: 22 mmol/L (ref 22–32)
Calcium: 8.9 mg/dL (ref 8.9–10.3)
Chloride: 106 mmol/L (ref 98–111)
Creatinine, Ser: 0.73 mg/dL (ref 0.44–1.00)
GFR calc Af Amer: >60 mL/min (ref >60)
GFR calc non Af Amer: >60 mL/min (ref >60)
Glucose, Bld: 142 mg/dL — ABNORMAL HIGH (ref 70–99)
POTASSIUM: 3.6 mmol/L (ref 3.5–5.1)
Sodium: 137 mmol/L (ref 135–145)
Total Bilirubin: 0.5 mg/dL (ref 0.3–1.2)
Total Protein: 7.1 g/dL (ref 6.5–8.1)

## 2018-06-13 LAB — URINALYSIS, ROUTINE W REFLEX MICROSCOPIC
BILIRUBIN URINE: NEGATIVE
Glucose, UA: NEGATIVE mg/dL
KETONES UR: NEGATIVE mg/dL
LEUKOCYTE UA: NEGATIVE
Nitrite: NEGATIVE
Protein, ur: NEGATIVE mg/dL
Specific Gravity, Urine: 1.009 (ref 1.005–1.030)
pH: 6 (ref 5.0–8.0)

## 2018-06-13 LAB — CBC WITH DIFFERENTIAL/PLATELET
Abs Immature Granulocytes: 0.13 10*3/uL — ABNORMAL HIGH (ref 0.00–0.07)
BASOS PCT: 0 %
Basophils Absolute: 0 10*3/uL (ref 0.0–0.1)
Eosinophils Absolute: 0.1 10*3/uL (ref 0.0–0.5)
Eosinophils Relative: 0 %
HCT: 42 % (ref 36.0–46.0)
Hemoglobin: 13.2 g/dL (ref 12.0–15.0)
Immature Granulocytes: 1 %
Lymphocytes Relative: 6 %
Lymphs Abs: 1.4 10*3/uL (ref 0.7–4.0)
MCH: 25.1 pg — ABNORMAL LOW (ref 26.0–34.0)
MCHC: 31.4 g/dL (ref 30.0–36.0)
MCV: 79.8 fL — ABNORMAL LOW (ref 80.0–100.0)
Monocytes Absolute: 1 10*3/uL (ref 0.1–1.0)
Monocytes Relative: 4 %
NRBC: 0 % (ref 0.0–0.2)
Neutro Abs: 21.1 10*3/uL — ABNORMAL HIGH (ref 1.7–7.7)
Neutrophils Relative %: 89 %
Platelets: 332 10*3/uL (ref 150–400)
RBC: 5.26 MIL/uL — ABNORMAL HIGH (ref 3.87–5.11)
RDW: 14.6 % (ref 11.5–15.5)
WBC: 23.7 10*3/uL — ABNORMAL HIGH (ref 4.0–10.5)

## 2018-06-13 LAB — CBG MONITORING, ED: Glucose-Capillary: 130 mg/dL — ABNORMAL HIGH (ref 70–99)

## 2018-06-13 MED ORDER — SODIUM CHLORIDE 0.9 % IV SOLN
2.0000 g | INTRAVENOUS | Status: DC
  Administered 2018-06-13: 2 g via INTRAVENOUS
  Filled 2018-06-13: qty 20

## 2018-06-13 MED ORDER — KETOCONAZOLE 2 % EX CREA: TOPICAL_CREAM | CUTANEOUS | 0 refills | Status: DC

## 2018-06-13 MED ORDER — IOPAMIDOL (ISOVUE-370) INJECTION 76%: 100.0000 mL | Freq: Once | INTRAVENOUS | Status: DC | PRN

## 2018-06-13 MED ORDER — ACETAMINOPHEN 325 MG PO TABS
650.0000 mg | ORAL_TABLET | Freq: Once | ORAL | Status: AC
  Administered 2018-06-13: 650 mg via ORAL
  Filled 2018-06-13: qty 2

## 2018-06-13 MED ORDER — VANCOMYCIN HCL IN DEXTROSE 1-5 GM/200ML-% IV SOLN: 1000.0000 mg | Freq: Once | INTRAVENOUS | Status: DC

## 2018-06-13 MED ORDER — VANCOMYCIN HCL 10 G IV SOLR
2000.0000 mg | Freq: Once | INTRAVENOUS | Status: AC
  Administered 2018-06-13: 2000 mg via INTRAVENOUS
  Filled 2018-06-13: qty 2000

## 2018-06-13 MED ORDER — IOHEXOL 300 MG/ML  SOLN
100.0000 mL | Freq: Once | INTRAMUSCULAR | Status: AC | PRN
  Administered 2018-06-13: 100 mL via INTRAVENOUS

## 2018-06-13 MED ORDER — DIPHENHYDRAMINE HCL 50 MG/ML IJ SOLN
12.5000 mg | Freq: Once | INTRAMUSCULAR | Status: AC
  Administered 2018-06-14: 12.5 mg via INTRAVENOUS
  Filled 2018-06-13: qty 1

## 2018-06-13 MED ORDER — SODIUM CHLORIDE 0.9 % IV BOLUS
1000.0000 mL | Freq: Once | INTRAVENOUS | Status: AC
  Administered 2018-06-13: 1000 mL via INTRAVENOUS

## 2018-06-13 MED ORDER — DOXYCYCLINE HYCLATE 100 MG PO CAPS: 100.0000 mg | ORAL_CAPSULE | Freq: Two times a day (BID) | ORAL | 0 refills | Status: DC

## 2018-06-13 NOTE — ED Triage Notes (Signed)
Pt. Stated, I have some type of disease but I can't think of the name. I started having ankle swelling and my leg is hurting. Pt. Has a wound on the lateral part of her ankle, it loooks like a scab.

## 2018-06-13 NOTE — ED Provider Notes (Signed)
[facility] EMERGENCY DEPARTMENT Provider Note   CSN: [id number] Arrival date & time: 06/13/18  1818    History   Chief Complaint Chief Complaint  Patient presents with  . Leg Pain  . Ankle Pain    HPI Audrey Burch is a 23 y.o. female with a past medical history of morbid obesity, thyroidectomy, and chronic skin condition of the ankles that she is unsure what her diagnosis is.  She states that she was feeling well this morning however later on in the day she began having pain in her ankle and then began having pain all the way up the medial portion of her leg.  She says it is difficult to walk.  She has never had anything like this before.  She has associated chills, myalgias and subjective fever prior to arrival.  She denies any respiratory illness, urinary symptoms, back pain, abdominal pain nausea or vomiting.     HPI  Past Medical History:  Diagnosis Date  . Asthma   . Medical history non-contributory   . Thyroid disease     Patient Active Problem List   Diagnosis Date Noted  . Hypovitaminosis D 04/30/2016  . 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 Hypocalcemia 03/20/2015  . 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 Acanthosis nigricans, acquired 07/10/2014  . Dyspepsia 07/10/2014    Past Surgical History:  Procedure Laterality Date  . NO PAST SURGERIES    . THYROIDECTOMY    . TONSILLECTOMY       OB History    Gravida  0   Para  0   Term  0   Preterm  0   AB  0   Living  0     SAB  0   TAB  0   Ectopic  0   Multiple  0   Live Births  0            Home Medications    Prior to Admission medications   Medication Sig Start Date End Date Taking? Authorizing Provider  levothyroxine (SYNTHROID, LEVOTHROID) 50 MCG tablet Take 50 mcg by mouth at bedtime. 05/01/18  Yes [provider]  doxycycline (VIBRAMYCIN) 100 MG capsule Take 1 capsule (100 mg total) by mouth 2 (two)  times daily. One po bid x 7 days 06/13/18   Arthor Captain, PA-C  ketoconazole (NIZORAL) 2 % cream Apply to dry feet, in between each toe and to the entire foot once daily 06/13/18   Arthor Captain, PA-C  phenazopyridine (PYRIDIUM) 100 MG tablet Take 1 tablet (100 mg total) by mouth 3 (three) times daily as needed for pain. Patient not taking: Reported on 06/13/2018 11/12/17   Poe, Deirdre C, CNM  sulfamethoxazole-trimethoprim (BACTRIM DS) 800-160 MG tablet Take 1 tablet by mouth 2 (two) times daily. Patient not taking: Reported on 06/13/2018 11/12/17   Danae Orleans, CNM    Family History Family History  Problem Relation Age of Onset  . Cancer Maternal Grandmother   . Hypertension Maternal Grandmother   . Diabetes Maternal Grandfather     Social History Social History   Tobacco Use  . Smoking status: Never Smoker  . Smokeless tobacco: Never Used  Substance Use Topics  . Alcohol use: No  . Drug use: No     Allergies   Patient has no known allergies.   Review of Systems Review of Systems  Ten systems reviewed and are negative for acute change, except as noted in the  HPI.   Physical Exam Updated Vital Signs BP (!) 122/54 (BP Location: Right Arm)   Pulse 77   Temp 98.8 F (37.1 C) (Oral)   Resp 16   Ht 5\' 1"  (1.549 m)   Wt 97.5 kg   LMP 06/13/2018 Comment: pt  stated, I have had a period for almost a year.  SpO2 100%   BMI 40.62 kg/m   Physical Exam Vitals signs and nursing note reviewed.  Constitutional:      General: She is not in acute distress.    Appearance: She is well-developed. She is not diaphoretic.  HENT:     Head: Normocephalic and atraumatic.  Eyes:     General: No scleral icterus.    Conjunctiva/sclera: Conjunctivae normal.  Neck:     Musculoskeletal: Normal range of motion.  Cardiovascular:     Rate and Rhythm: Normal rate and regular rhythm.     Heart sounds: Normal heart sounds. No murmur. No friction rub. No gallop.   Pulmonary:      Effort: Pulmonary effort is normal. No respiratory distress.     Breath sounds: Normal breath sounds.  Abdominal:     General: Bowel sounds are normal. There is no distension.     Palpations: Abdomen is soft. There is no mass.     Tenderness: There is no abdominal tenderness. There is no guarding.  Skin:    General: Skin is warm and dry.       Neurological:     Mental Status: She is alert and oriented to person, place, and time.  Psychiatric:        Behavior: Behavior normal.      ED Treatments / Results  Labs (all labs ordered are listed, but only abnormal results are displayed) Labs Reviewed  CBC WITH DIFFERENTIAL/PLATELET - Abnormal; Notable for the following components:      Result Value   WBC 23.7 (*)    RBC 5.26 (*)    MCV 79.8 (*)    MCH 25.1 (*)    Neutro Abs 21.1 (*)    Abs Immature Granulocytes 0.13 (*)    All other components within normal limits  COMPREHENSIVE METABOLIC PANEL - Abnormal; Notable for the following components:   Glucose, Bld 142 (*)    Alkaline Phosphatase 35 (*)    All other components within normal limits  URINALYSIS, ROUTINE W REFLEX MICROSCOPIC - Abnormal; Notable for the following components:   Hgb urine dipstick MODERATE (*)    Bacteria, UA RARE (*)    All other components within normal limits  CBG MONITORING, ED - Abnormal; Notable for the following components:   Glucose-Capillary 130 (*)    All other components within normal limits  CULTURE, BLOOD (ROUTINE X 2)  CULTURE, BLOOD (ROUTINE X 2)  LACTIC ACID, PLASMA  I-STAT BETA HCG BLOOD, ED ([facility], [facility], [facility] ONLY)    EKG None  Radiology Dg Tibia/fibula Right  Result Date: 06/13/2018 CLINICAL DATA:  Initial evaluation for abscess on right lateral ankle. EXAM: RIGHT TIBIA AND FIBULA - 2 VIEW COMPARISON:  None. FINDINGS: No acute fracture dislocation. Focal soft tissue swelling seen lateral to the right ankle. No radiopaque foreign body. No abnormal soft tissue emphysema. IMPRESSION: 1. No  acute osseous abnormality. 2. Focal soft tissue swelling at the lateral aspect of the right ankle. No radiopaque foreign body or abnormal soft tissue emphysema. No evidence for osteomyelitis. Electronically Signed   By: Rise Mu M.D.   On: 06/13/2018 21:13  Dg Ankle Complete Right  Result Date: 06/13/2018 CLINICAL DATA:  Initial evaluation for abscess on right lateral ankle. EXAM: RIGHT ANKLE - COMPLETE 3+ VIEW COMPARISON:  None. FINDINGS: Focal soft tissue swelling at the lateral aspect of the right ankle. Associated skin thickening. No radiopaque foreign body. No dissecting soft tissue emphysema. No evidence for osteomyelitis. No acute fracture or dislocation. Focal cortical irregularity with exostosis at the distal right fibula, chronic in appearance, possibly reflecting a small osteochondroma. Small plantar calcaneal enthesophyte noted. IMPRESSION: 1. Focal soft tissue swelling at the lateral aspect of the right ankle. No radiopaque foreign body. Dissecting soft tissue emphysema, or evidence for osteomyelitis. 2. No other acute osseous abnormality. Electronically Signed   By: Rise Mu M.D.   On: 06/13/2018 21:15   Ct Extremity Lower Right W Contrast  Result Date: 06/13/2018 CLINICAL DATA:  23 year old female with soft tissue swelling and cellulitis of the right lower extremity. EXAM: CT OF THE LOWER RIGHT EXTREMITY WITH CONTRAST TECHNIQUE: Multidetector CT imaging of the lower right extremity was performed according to the standard protocol following intravenous contrast administration. COMPARISON:  Right lower extremity radiograph dated 06/13/2018 CONTRAST:  OMNIPAQUE IOHEXOL 300 MG/ML  SOLN FINDINGS: Bones/Joint/Cartilage There is no acute fracture or dislocation. The bones are well mineralized. No arthritic changes. No periosteal elevation or erosion. No joint effusion. Ligaments Suboptimally assessed by CT. Muscles and Tendons No acute findings. No fluid  collection/abscess or hematoma. Soft tissues There is thickening of the skin with stranding of the subcutaneous fat along the lateral aspect of the distal leg and ankle consistent with cellulitis. IMPRESSION: Cellulitis of the lateral aspect of the distal leg and ankle. No abscess. Electronically Signed   By: Elgie Collard M.D.   On: 06/13/2018 22:58    Procedures Procedures (including critical care time)  Medications Ordered in ED Medications  acetaminophen (TYLENOL) tablet 650 mg (650 mg Oral Given 06/13/18 1846)  sodium chloride 0.9 % bolus 1,000 mL (0 mLs Intravenous Stopped 06/14/18 0035)  vancomycin (VANCOCIN) 2,000 mg in sodium chloride 0.9 % 500 mL IVPB (0 mg Intravenous Stopped 06/14/18 0003)  iohexol (OMNIPAQUE) 300 MG/ML solution 100 mL (100 mLs Intravenous Contrast Given 06/13/18 2220)  diphenhydrAMINE (BENADRYL) injection 12.5 mg (12.5 mg Intravenous Given 06/14/18 0001)     Initial Impression / Assessment and Plan / ED Course  I have reviewed the triage vital signs and the nursing notes.  Pertinent labs & imaging results that were available during my care of the patient were reviewed by me and considered in my medical decision making (see chart for details).       She was cellulitis of the right lower extremity.  Patient appeared to have pain out of proportion to examination with exquisite leg tenderness medially without erythema.  This prompted concern for potential necrotizing fasciitis in this young female with hyperglycemia.  Patient also had obesity, significant macerated foot fungus, hirsutism and the clinical features of PCOS/undiagnosed diabetes.  I have relayed my concerns the patient that she should have her sugars checked with primary care.  Ultimately patient's CT of the leg is negative for necrotizing fasciitis.  Her white blood cell count is markedly elevated and she was given IV antibiotics however she began having allergic reaction with hive formation to vancomycin  and this was discontinued.  Patient did receive Rocephin and will be discharged on doxycycline.  She was given Benadryl prior to discharge.  Discussed return precautions and outpatient follow-up. Final diagnoses:  Cellulitis of  right lower extremity  Tinea pedis of both feet  High blood sugar    ED Discharge Orders         Ordered    doxycycline (VIBRAMYCIN) 100 MG capsule  2 times daily     06/13/18 2330    ketoconazole (NIZORAL) 2 % cream     06/13/18 2330           Arthor Captain, PA-C 06/14/18 2245    Pricilla Loveless, MD 06/17/18 1626

## 2018-06-13 NOTE — Progress Notes (Signed)
Pharmacy Antibiotic Note  Audrey Burch is a 23 y.o. female admitted on 06/13/2018 with cellulitis.  Pharmacy has been consulted for vancomycin dosing.  Plan: Give vancomycin 2g IV x 1, start vancomycin 1,750mg  IV Q24h Also given ceftriaxone 2g IV x 1 in the ED Monitor clinical picture, renal function, vanc levels prn F/U C&S, abx deescalation / LOT  Height: 5\' 1"  (154.9 cm) Weight: 215 lb (97.5 kg) IBW/kg (Calculated) : 47.8  Temp (24hrs), Avg:101.4 F (38.6 C), Min:101.4 F (38.6 C), Max:101.4 F (38.6 C)  Recent Labs  Lab 06/13/18 2042  WBC 23.7*  CREATININE 0.73    Estimated Creatinine Clearance: 118.9 mL/min (by C-G formula based on SCr of 0.73 mg/dL).    No Known Allergies   Thank you for allowing pharmacy to be a part of this patient's care.  Armandina Stammer 06/13/2018 9:30 PM

## 2018-06-13 NOTE — ED Notes (Signed)
RN informed ok for Pt to receive visitor 

## 2018-06-13 NOTE — ED Notes (Signed)
Informed pt we need urine sample 

## 2018-06-13 NOTE — Discharge Instructions (Signed)

## 2018-06-14 NOTE — ED Notes (Signed)
Pt with redness and itching to face and torso. vanc stopped and flushed. MD aware. Benedryl given.

## 2018-06-14 NOTE — ED Notes (Signed)
Pt verbalized understanding of DC instructions. No questions at this time. Pt ambulatory and with no S/SX of distress

## 2018-06-18 LAB — CULTURE, BLOOD (ROUTINE X 2)
Culture: NO GROWTH
Culture: NO GROWTH

## 2018-06-19 ENCOUNTER — Emergency Department (HOSPITAL_COMMUNITY): Payer: Self-pay

## 2018-06-19 ENCOUNTER — Encounter (HOSPITAL_COMMUNITY): Payer: Self-pay | Admitting: *Deleted

## 2018-06-19 ENCOUNTER — Other Ambulatory Visit: Payer: Self-pay

## 2018-06-19 ENCOUNTER — Emergency Department (HOSPITAL_COMMUNITY)
Admission: EM | Admit: 2018-06-19 | Discharge: 2018-06-19 | Disposition: A | Discharge status: Home / Self Care | Payer: Self-pay | Attending: Emergency Medicine | Admitting: Emergency Medicine

## 2018-06-19 DIAGNOSIS — L03115 Cellulitis of right lower limb: Secondary | ICD-10-CM | POA: Insufficient documentation

## 2018-06-19 DIAGNOSIS — J45909 Unspecified asthma, uncomplicated: Secondary | ICD-10-CM | POA: Insufficient documentation

## 2018-06-19 DIAGNOSIS — J069 Acute upper respiratory infection, unspecified: Secondary | ICD-10-CM | POA: Insufficient documentation

## 2018-06-19 DIAGNOSIS — Z79899 Other long term (current) drug therapy: Secondary | ICD-10-CM | POA: Insufficient documentation

## 2018-06-19 NOTE — ED Provider Notes (Signed)
[facility] EMERGENCY DEPARTMENT Provider Note   CSN: [id number] Arrival date & time: 06/19/18  1359    History   Chief Complaint Chief Complaint  Patient presents with  . Cough  . Abscess    HPI Audrey Burch is a 23 y.o. female.     Patient is a 23 year old female with past medical history of acanthosis nigracans, prediabetes.  She presents today for evaluation of right ankle infection and cough.  She was diagnosed 1 week ago with cellulitis of the leg.  She had work-up performed along with IV antibiotics and was discharged to home.  She has been taking doxycycline for the past week.  During the past 5 days, she has developed chest congestion and cough that is intermittently productive of yellow sputum.  She denies any chest pain or difficulty breathing.  She has had a fever at home and is febrile upon presentation to the ER.  The history is provided by the patient.  Cough  Cough characteristics:  Productive Sputum characteristics:  Yellow Severity:  Moderate Onset quality:  Sudden Timing:  Constant Progression:  Unchanged Chronicity:  New Relieved by:  Nothing Worsened by:  Nothing Ineffective treatments:  None tried Abscess    Past Medical History:  Diagnosis Date  . Asthma   . Medical history non-contributory   . Thyroid disease     Patient Active Problem List   Diagnosis Date Noted  . Hypovitaminosis D 04/30/2016  . 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 Hypocalcemia 03/20/2015  . 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 Acanthosis nigricans, acquired 07/10/2014  . Dyspepsia 07/10/2014    Past Surgical History:  Procedure Laterality Date  . NO PAST SURGERIES    . THYROIDECTOMY    . TONSILLECTOMY       OB History    Gravida  0   Para  0   Term  0   Preterm  0   AB  0   Living  0     SAB  0   TAB  0   Ectopic  0   Multiple  0   Live Births  0            Home  Medications    Prior to Admission medications   Medication Sig Start Date End Date Taking? Authorizing Provider  doxycycline (VIBRAMYCIN) 100 MG capsule Take 1 capsule (100 mg total) by mouth 2 (two) times daily. One po bid x 7 days 06/13/18   Arthor Captain, PA-C  ketoconazole (NIZORAL) 2 % cream Apply to dry feet, in between each toe and to the entire foot once daily 06/13/18   Arthor Captain, PA-C  levothyroxine (SYNTHROID, LEVOTHROID) 50 MCG tablet Take 50 mcg by mouth at bedtime. 05/01/18   [provider]  phenazopyridine (PYRIDIUM) 100 MG tablet Take 1 tablet (100 mg total) by mouth 3 (three) times daily as needed for pain. Patient not taking: Reported on 06/13/2018 11/12/17   Poe, Deirdre C, CNM  sulfamethoxazole-trimethoprim (BACTRIM DS) 800-160 MG tablet Take 1 tablet by mouth 2 (two) times daily. Patient not taking: Reported on 06/13/2018 11/12/17   Danae Orleans, CNM    Family History Family History  Problem Relation Age of Onset  . Cancer Maternal Grandmother   . Hypertension Maternal Grandmother   . Diabetes Maternal Grandfather     Social History Social History   Tobacco Use  . Smoking status: Never Smoker  . Smokeless tobacco: Never Used  Substance Use Topics  . Alcohol use: No  . Drug use: No     Allergies   Patient has no known allergies.   Review of Systems Review of Systems  Respiratory: Positive for cough.   All other systems reviewed and are negative.    Physical Exam Updated Vital Signs BP (!) 151/82 (BP Location: Right Arm)   Pulse (!) 109   Temp 98.5 F (36.9 C) (Oral)   Resp 17   LMP 06/13/2018 Comment: pt  stated, I have had a period for almost a year.  Physical Exam Vitals signs and nursing note reviewed.  Constitutional:      General: She is not in acute distress.    Appearance: She is well-developed. She is not diaphoretic.  HENT:     Head: Normocephalic and atraumatic.  Neck:     Musculoskeletal: Normal range of motion  and neck supple.  Cardiovascular:     Rate and Rhythm: Normal rate and regular rhythm.     Heart sounds: No murmur. No friction rub. No gallop.   Pulmonary:     Effort: Pulmonary effort is normal. No respiratory distress.     Breath sounds: Normal breath sounds. No wheezing.  Abdominal:     General: Bowel sounds are normal. There is no distension.     Palpations: Abdomen is soft.     Tenderness: There is no abdominal tenderness.  Musculoskeletal: Normal range of motion.     Comments: The right ankle has areas of healing cellulitis/blistering.  There is no warmth or significant erythema.  There is no purulent drainage.  Skin:    General: Skin is warm and dry.  Neurological:     Mental Status: She is alert and oriented to person, place, and time.      ED Treatments / Results  Labs (all labs ordered are listed, but only abnormal results are displayed) Labs Reviewed - No data to display  EKG None  Radiology No results found.  Procedures Procedures (including critical care time)  Medications Ordered in ED Medications - No data to display   Initial Impression / Assessment and Plan / ED Course  I have reviewed the triage vital signs and the nursing notes.  Pertinent labs & imaging results that were available during my care of the patient were reviewed by me and considered in my medical decision making (see chart for details).  Patient presents with 2 complaints that seem unrelated.  First she reports swelling to her right ankle.  She was recently diagnosed with cellulitis and treated with doxycycline.  This area seems to be getting better.  I see no warmth, erythema, or purulent drainage.  Highly doubt DVT.  She is also reporting cough and fever that started while on the doxycycline and I suspect is viral in nature.  Her chest x-ray is clear.  I will advise her to take over-the-counter medications, drink plenty of fluids, and follow-up as needed.  Final Clinical  Impressions(s) / ED Diagnoses   Final diagnoses:  None    ED Discharge Orders    None       Geoffery Lyons, MD 06/19/18 1507

## 2018-06-19 NOTE — ED Notes (Signed)
Pt verbalized understanding of d/c instructions and has no further questions, VSS, NAD.  

## 2018-06-19 NOTE — ED Notes (Signed)
Pt reports she was seen here in the ED on the 24th last week for same.  Noticed an abscess on her R ankle laterally Monday last week, with purulent drainage.  She then started to have chills and severe pain in her R leg with non-productive cough.  She was sent home with rx for abx and have been taking OTC Nyquil without relief.  She is A&O x 4, in NAD.

## 2018-06-19 NOTE — ED Notes (Signed)
Patient transported to X-ray 

## 2018-06-19 NOTE — Discharge Instructions (Addendum)
Drink plenty of fluids and get plenty of rest.  Tylenol 1000 mg rotated with ibuprofen 600 mg every 4 hours as needed for fever.  Continue other over-the-counter medications as needed for relief of your symptoms.  Follow-up with your primary doctor if symptoms or not improving in the next 3 to 4 days.

## 2018-09-13 ENCOUNTER — Telehealth: Payer: Self-pay | Admitting: Family Medicine

## 2018-09-13 ENCOUNTER — Emergency Department (HOSPITAL_COMMUNITY)
Admission: EM | Admit: 2018-09-13 | Discharge: 2018-09-13 | Disposition: A | Discharge status: Home / Self Care | Payer: Medicaid Other | Attending: Emergency Medicine | Admitting: Emergency Medicine

## 2018-09-13 ENCOUNTER — Encounter (HOSPITAL_COMMUNITY): Payer: Self-pay | Admitting: Emergency Medicine

## 2018-09-13 ENCOUNTER — Other Ambulatory Visit: Payer: Self-pay

## 2018-09-13 DIAGNOSIS — J45909 Unspecified asthma, uncomplicated: Secondary | ICD-10-CM | POA: Insufficient documentation

## 2018-09-13 DIAGNOSIS — B373 Candidiasis of vulva and vagina: Secondary | ICD-10-CM

## 2018-09-13 DIAGNOSIS — R109 Unspecified abdominal pain: Secondary | ICD-10-CM

## 2018-09-13 DIAGNOSIS — B379 Candidiasis, unspecified: Secondary | ICD-10-CM | POA: Insufficient documentation

## 2018-09-13 DIAGNOSIS — Z79899 Other long term (current) drug therapy: Secondary | ICD-10-CM | POA: Insufficient documentation

## 2018-09-13 DIAGNOSIS — B3731 Acute candidiasis of vulva and vagina: Secondary | ICD-10-CM

## 2018-09-13 DIAGNOSIS — R1084 Generalized abdominal pain: Secondary | ICD-10-CM | POA: Insufficient documentation

## 2018-09-13 DIAGNOSIS — G8929 Other chronic pain: Secondary | ICD-10-CM | POA: Insufficient documentation

## 2018-09-13 LAB — URINALYSIS, ROUTINE W REFLEX MICROSCOPIC
Bilirubin Urine: NEGATIVE
Glucose, UA: NEGATIVE mg/dL
Ketones, ur: NEGATIVE mg/dL
Nitrite: NEGATIVE
Protein, ur: NEGATIVE mg/dL
Specific Gravity, Urine: 1.012 (ref 1.005–1.030)
pH: 7 (ref 5.0–8.0)

## 2018-09-13 LAB — WET PREP, GENITAL
Clue Cells Wet Prep HPF POC: NONE SEEN
Sperm: NONE SEEN
Trich, Wet Prep: NONE SEEN

## 2018-09-13 LAB — I-STAT BETA HCG BLOOD, ED (MC, WL, AP ONLY): I-stat hCG, quantitative: <5 m[IU]/mL (ref <5)

## 2018-09-13 MED ORDER — FLUCONAZOLE 150 MG PO TABS: ORAL_TABLET | ORAL | 0 refills | Status: DC

## 2018-09-13 MED ORDER — SODIUM CHLORIDE 0.9% FLUSH
3.0000 mL | Freq: Once | INTRAVENOUS | Status: AC
  Administered 2018-09-13: 3 mL via INTRAVENOUS

## 2018-09-13 NOTE — Telephone Encounter (Signed)
Patient called in stating that she was seen in the past for vaginal issues and they had subsided but now they are back and they are very painful and needs an emergent appointment. After speaking with Morrie Sheldon she advised patient to head over to [facility] ED if it is an emergent need. Patient verbalized understanding.

## 2018-09-13 NOTE — ED Provider Notes (Signed)
[facility] Provider Note   CSN: [id number] Arrival date & time: 09/13/18  [id number]    History   Chief Complaint Chief Complaint  Patient presents with  . Recurrent UTI  . Abdominal Pain  . Nausea  . Vaginal Pain    HPI Audrey Burch is a 23 y.o. female.     HPI   23 year old female presents today with several complaints.  Patient notes a significant past medical history of chronic abdominal pain for the last year she has been evaluated previously with no significant findings.  Notes she was post follow-up for further evaluation but did not do this.  Patient notes that over the weekend she developed vaginal irritation with intercourse.  She notes walking and sitting makes the vaginal area irritated.  She denies any discharge, she notes when she was wiping she had a small amount of blood on her tissue this morning, none subsequently.  She notes that when she urinates she has irritation, notes a slight odor to her urine but has not looked at the color of her urine.  She notes that her abdominal discomfort is chronic bilateral mid abdomen and is unchanged.  She denies fever presently.  No medications prior to arrival.  She attempted to be seen by her OB/GYN but was referred to the emergency room.  She notes she had an abscess approximately 2 months ago and is on antibiotics no recent antibiotics since that time.    Past Medical History:  Diagnosis Date  . Asthma   . Medical history non-contributory   . Thyroid disease     Patient Active Problem List   Diagnosis Date Noted  . Hypovitaminosis D 04/30/2016  . 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 Hypocalcemia 03/20/2015  . 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 Acanthosis nigricans, acquired 07/10/2014  . Dyspepsia 07/10/2014    Past Surgical History:  Procedure Laterality Date  . NO PAST SURGERIES    . THYROIDECTOMY    . TONSILLECTOMY        OB History    Gravida  0   Para  0   Term  0   Preterm  0   AB  0   Living  0     SAB  0   TAB  0   Ectopic  0   Multiple  0   Live Births  0            Home Medications    Prior to Admission medications   Medication Sig Start Date End Date Taking? Authorizing Provider  acetaminophen (TYLENOL) 500 MG tablet Take 1,000 mg by mouth every 6 (six) hours as needed for mild pain.   Yes [provider]  albuterol (VENTOLIN HFA) 108 (90 Base) MCG/ACT inhaler Inhale 1 puff into the lungs every 6 (six) hours as needed for wheezing. 08/03/18  Yes [provider]  Calcium Carb-Cholecalciferol (CALCIUM 600+D) 600-800 MG-UNIT TABS Take 1 tablet by mouth daily.   Yes [provider]  diphenhydrAMINE (SOMINEX) 25 MG tablet Take 50 mg by mouth at bedtime as needed for sleep.   Yes [provider]  levothyroxine (SYNTHROID, LEVOTHROID) 50 MCG tablet Take 50 mcg by mouth at bedtime. 05/01/18  Yes [provider]  doxycycline (VIBRAMYCIN) 100 MG capsule Take 1 capsule (100 mg total) by mouth 2 (two) times daily. One po bid x 7 days Patient not taking: Reported on 09/13/2018 06/13/18   Arthor CaptainHarris, Abigail, PA-C  fluconazole (DIFLUCAN) 150 MG tablet Please take one pill today and another one in 72 hours if your symptoms persist 09/13/18   Elen Acero, Tinnie Gens, PA-C  ketoconazole (NIZORAL) 2 % cream Apply to dry feet, in between each toe and to the entire foot once daily Patient not taking: Reported on 09/13/2018 06/13/18   Arthor Captain, PA-C  phenazopyridine (PYRIDIUM) 100 MG tablet Take 1 tablet (100 mg total) by mouth 3 (three) times daily as needed for pain. Patient not taking: Reported on 09/13/2018 11/12/17   Poe, Deirdre C, CNM  sulfamethoxazole-trimethoprim (BACTRIM DS) 800-160 MG tablet Take 1 tablet by mouth 2 (two) times daily. Patient not taking: Reported on 06/13/2018 11/12/17   Danae Orleans, CNM    Family History Family History  Problem  Relation Age of Onset  . Cancer Maternal Grandmother   . Hypertension Maternal Grandmother   . Diabetes Maternal Grandfather     Social History Social History   Tobacco Use  . Smoking status: Never Smoker  . Smokeless tobacco: Never Used  Substance Use Topics  . Alcohol use: No  . Drug use: No     Allergies   Vancomycin   Review of Systems Review of Systems  All other systems reviewed and are negative.  Physical Exam Updated Vital Signs BP 133/84 (BP Location: Right Arm)   Pulse 85   Temp 98.6 F (37 C) (Oral)   Resp 18   LMP 08/20/2018   SpO2 100%   Physical Exam Vitals signs and nursing note reviewed.  Constitutional:      Appearance: She is well-developed.  HENT:     Head: Normocephalic and atraumatic.  Eyes:     General: No scleral icterus.       Right eye: No discharge.        Left eye: No discharge.     Conjunctiva/sclera: Conjunctivae normal.     Pupils: Pupils are equal, round, and reactive to light.  Neck:     Musculoskeletal: Normal range of motion.     Vascular: No JVD.     Trachea: No tracheal deviation.  Pulmonary:     Effort: Pulmonary effort is normal.     Breath sounds: No stridor.  Abdominal:     Comments: Minimal generalized mid abdominal tenderness bilateral-nonfocal no rebound or guarding  Genitourinary:    Comments: Redness and irritation noted to the bilateral labia majora, internal exam shows redness and friability, no purulence, small amount of cottage cheese like discharge Neurological:     Mental Status: She is alert and oriented to person, place, and time.     Coordination: Coordination normal.  Psychiatric:        Behavior: Behavior normal.        Thought Content: Thought content normal.        Judgment: Judgment normal.      ED Treatments / Results  Labs (all labs ordered are listed, but only abnormal results are displayed) Labs Reviewed  WET PREP, GENITAL - Abnormal; Notable for the following components:       Result Value   Yeast Wet Prep HPF POC PRESENT (*)    WBC, Wet Prep HPF POC MANY (*)    All other components within normal limits  URINALYSIS, ROUTINE W REFLEX MICROSCOPIC - Abnormal; Notable for the following components:   Hgb urine dipstick MODERATE (*)    Leukocytes,Ua TRACE (*)    Bacteria, UA RARE (*)    All other components within normal limits  URINE CULTURE  I-STAT BETA HCG BLOOD, ED ([facility], [facility], [facility] ONLY)  GC/CHLAMYDIA PROBE AMP ([place]) NOT AT [facility]    EKG None  Radiology No results found.  Procedures Procedures (including critical care time)  Medications Ordered in ED Medications  sodium chloride flush (NS) 0.9 % injection 3 mL (3 mLs Intravenous Given 09/13/18 1132)     Initial Impression / Assessment and Plan / ED Course  I have reviewed the triage vital signs and the nursing notes.  Pertinent labs & imaging results that were available during my care of the patient were reviewed by me and considered in my medical decision making (see chart for details).          Assessment/Plan: Patient presents with several complaints.  Patient having acute yeast vaginitis.  She be treated with fluconazole.  Patient is having dysuria, urinalysis not consistent with urinary tract infection, her symptoms are likely secondary to the yeast vaginitis.  Urine culture sent if significant growth patient will need follow-up phone call for symptomatic check.  Patient also endorses year-long abdominal discomfort.  She has had work-up previously encouraged to continue outpatient follow-up for this.  She verbalized understanding and agreement to today's plan had no further questions concerns.    Final Clinical Impressions(s) / ED Diagnoses   Final diagnoses:  Yeast vaginitis  Chronic abdominal pain    ED Discharge Orders         Ordered    fluconazole (DIFLUCAN) 150 MG tablet     09/13/18 1340           Eyvonne Mechanic, PA-C 09/13/18 1344    Shaune Pollack, MD 09/14/18  2018

## 2018-09-13 NOTE — Discharge Instructions (Addendum)
Please read the attached information.  Please use medication as directed.  Please follow-up with OB/GYN if your symptoms persist.  Please follow-up with your primary care provider for ongoing evaluation and management of your abdominal discomfort.

## 2018-09-13 NOTE — ED Notes (Signed)
Patient is getting into a gown call bell in reach 

## 2018-09-13 NOTE — ED Triage Notes (Signed)
Pt. Stated, I had a UTI and given Medication it was 6-7 monyhs ago and now its back and Im having some vaginal bleeding.

## 2018-09-13 NOTE — ED Notes (Signed)
Patient verbalizes understanding of discharge instructions. Opportunity for questioning and answers were provided. Armband removed by staff, pt discharged from ED.  

## 2018-09-14 LAB — URINE CULTURE

## 2018-09-14 LAB — GC/CHLAMYDIA PROBE AMP (~~LOC~~) NOT AT ARMC
Chlamydia: NEGATIVE
Neisseria Gonorrhea: NEGATIVE

## 2018-12-21 ENCOUNTER — Encounter: Payer: Self-pay | Admitting: *Deleted

## 2019-01-03 ENCOUNTER — Telehealth: Payer: Self-pay | Admitting: Obstetrics & Gynecology

## 2019-01-03 NOTE — Telephone Encounter (Signed)
Called the patient to inform of the upcoming appointment. Left a detailed voicemail inform the patient if she has been diagnosed with covid, in close contact with someone who has had covid, or experienced any flu-like symptoms in the past 14 days please give our office a call to reschedule. Also notified due to [place] restriction no children or vistors are allowed.

## 2019-01-04 ENCOUNTER — Encounter: Payer: Self-pay | Admitting: Obstetrics and Gynecology

## 2019-01-04 ENCOUNTER — Other Ambulatory Visit (HOSPITAL_COMMUNITY)
Admission: RE | Admit: 2019-01-04 | Discharge: 2019-01-04 | Disposition: A | Discharge status: Home / Self Care | Payer: Medicaid Other | Source: Ambulatory Visit | Attending: Obstetrics and Gynecology | Admitting: Obstetrics and Gynecology

## 2019-01-04 ENCOUNTER — Emergency Department (HOSPITAL_COMMUNITY)
Admission: EM | Admit: 2019-01-04 | Discharge: 2019-01-04 | Disposition: A | Discharge status: Home / Self Care | Payer: Medicaid Other | Attending: Emergency Medicine | Admitting: Emergency Medicine

## 2019-01-04 ENCOUNTER — Encounter: Payer: Self-pay | Admitting: Family Medicine

## 2019-01-04 ENCOUNTER — Ambulatory Visit (INDEPENDENT_AMBULATORY_CARE_PROVIDER_SITE_OTHER): Payer: Medicaid Other | Admitting: Obstetrics and Gynecology

## 2019-01-04 ENCOUNTER — Encounter (HOSPITAL_COMMUNITY): Payer: Self-pay | Admitting: Emergency Medicine

## 2019-01-04 ENCOUNTER — Other Ambulatory Visit: Payer: Self-pay

## 2019-01-04 VITALS — BP 126/86 | HR 80 | Wt 225.3 lb

## 2019-01-04 DIAGNOSIS — E890 Postprocedural hypothyroidism: Secondary | ICD-10-CM | POA: Diagnosis not present

## 2019-01-04 DIAGNOSIS — Z8669 Personal history of other diseases of the nervous system and sense organs: Secondary | ICD-10-CM | POA: Diagnosis not present

## 2019-01-04 DIAGNOSIS — R197 Diarrhea, unspecified: Secondary | ICD-10-CM | POA: Insufficient documentation

## 2019-01-04 DIAGNOSIS — Z6841 Body Mass Index (BMI) 40.0 and over, adult: Secondary | ICD-10-CM | POA: Diagnosis not present

## 2019-01-04 DIAGNOSIS — Z124 Encounter for screening for malignant neoplasm of cervix: Secondary | ICD-10-CM | POA: Insufficient documentation

## 2019-01-04 DIAGNOSIS — N939 Abnormal uterine and vaginal bleeding, unspecified: Secondary | ICD-10-CM | POA: Diagnosis not present

## 2019-01-04 DIAGNOSIS — E8881 Metabolic syndrome: Secondary | ICD-10-CM

## 2019-01-04 DIAGNOSIS — Z30011 Encounter for initial prescription of contraceptive pills: Secondary | ICD-10-CM | POA: Diagnosis not present

## 2019-01-04 DIAGNOSIS — Z20822 Contact with and (suspected) exposure to covid-19: Secondary | ICD-10-CM

## 2019-01-04 DIAGNOSIS — Z20828 Contact with and (suspected) exposure to other viral communicable diseases: Secondary | ICD-10-CM | POA: Insufficient documentation

## 2019-01-04 DIAGNOSIS — R112 Nausea with vomiting, unspecified: Secondary | ICD-10-CM

## 2019-01-04 LAB — URINALYSIS, ROUTINE W REFLEX MICROSCOPIC
Bacteria, UA: NONE SEEN
Bilirubin Urine: NEGATIVE
Glucose, UA: NEGATIVE mg/dL
Ketones, ur: NEGATIVE mg/dL
Leukocytes,Ua: NEGATIVE
Nitrite: NEGATIVE
Protein, ur: 30 mg/dL — AB
Specific Gravity, Urine: 1.017 (ref 1.005–1.030)
pH: 5 (ref 5.0–8.0)

## 2019-01-04 LAB — COMPREHENSIVE METABOLIC PANEL
ALT: 40 U/L (ref 0–44)
AST: 30 U/L (ref 15–41)
Albumin: 4.3 g/dL (ref 3.5–5.0)
Alkaline Phosphatase: 37 U/L — ABNORMAL LOW (ref 38–126)
Anion gap: 11 (ref 5–15)
BUN: 8 mg/dL (ref 6–20)
CO2: 22 mmol/L (ref 22–32)
Calcium: 9.5 mg/dL (ref 8.9–10.3)
Chloride: 104 mmol/L (ref 98–111)
Creatinine, Ser: 0.62 mg/dL (ref 0.44–1.00)
GFR calc Af Amer: >60 mL/min (ref >60)
GFR calc non Af Amer: >60 mL/min (ref >60)
Glucose, Bld: 109 mg/dL — ABNORMAL HIGH (ref 70–99)
Potassium: 4.3 mmol/L (ref 3.5–5.1)
Sodium: 137 mmol/L (ref 135–145)
Total Bilirubin: 0.6 mg/dL (ref 0.3–1.2)
Total Protein: 7.5 g/dL (ref 6.5–8.1)

## 2019-01-04 LAB — CBC
HCT: 45.4 % (ref 36.0–46.0)
Hemoglobin: 14.7 g/dL (ref 12.0–15.0)
MCH: 27.6 pg (ref 26.0–34.0)
MCHC: 32.4 g/dL (ref 30.0–36.0)
MCV: 85.3 fL (ref 80.0–100.0)
Platelets: 356 10*3/uL (ref 150–400)
RBC: 5.32 MIL/uL — ABNORMAL HIGH (ref 3.87–5.11)
RDW: 13.3 % (ref 11.5–15.5)
WBC: 8.9 10*3/uL (ref 4.0–10.5)
nRBC: 0 % (ref 0.0–0.2)

## 2019-01-04 LAB — I-STAT BETA HCG BLOOD, ED (MC, WL, AP ONLY): I-stat hCG, quantitative: <5 m[IU]/mL (ref <5)

## 2019-01-04 LAB — LIPASE, BLOOD: Lipase: 23 U/L (ref 11–51)

## 2019-01-04 MED ORDER — SODIUM CHLORIDE 0.9 % IV BOLUS: 1000.0000 mL | Freq: Once | INTRAVENOUS | Status: DC

## 2019-01-04 MED ORDER — NORETHINDRONE 0.35 MG PO TABS: 1.0000 | ORAL_TABLET | Freq: Every day | ORAL | 3 refills | Status: DC

## 2019-01-04 MED ORDER — PROMETHAZINE HCL 25 MG/ML IJ SOLN
25.0000 mg | Freq: Once | INTRAMUSCULAR | Status: AC
  Administered 2019-01-04: 25 mg via INTRAVENOUS
  Filled 2019-01-04: qty 1

## 2019-01-04 MED ORDER — ONDANSETRON 4 MG PO TBDP: 4.0000 mg | ORAL_TABLET | Freq: Three times a day (TID) | ORAL | 0 refills | Status: DC | PRN

## 2019-01-04 MED ORDER — SODIUM CHLORIDE 0.9 % IV BOLUS
1000.0000 mL | Freq: Once | INTRAVENOUS | Status: AC
  Administered 2019-01-04: 1000 mL via INTRAVENOUS

## 2019-01-04 MED ORDER — SODIUM CHLORIDE 0.9% FLUSH: 3.0000 mL | Freq: Once | INTRAVENOUS | Status: DC

## 2019-01-04 MED ORDER — DIPHENHYDRAMINE HCL 50 MG/ML IJ SOLN
25.0000 mg | Freq: Once | INTRAMUSCULAR | Status: AC
  Administered 2019-01-04: 15:00:00 25 mg via INTRAVENOUS
  Filled 2019-01-04: qty 1

## 2019-01-04 NOTE — ED Provider Notes (Signed)
I assumed care of patient at shift change from [facility].    Patient is here with Nausea, vomiting, and diarrhea for two days.    No abdominal pain, chills when vomiting.   Physical Exam  BP 135/89   Pulse 81   Temp 98.7 F (37.1 C) (Oral)   Resp 18   SpO2 98%   Physical Exam Vitals signs and nursing note reviewed.  Constitutional:      General: She is not in acute distress.    Appearance: She is not ill-appearing.  HENT:     Head: Normocephalic.  Abdominal:     General: There is no distension.     Palpations: Abdomen is soft.     Tenderness: There is no abdominal tenderness.  Skin:    General: Skin is warm.  Neurological:     Mental Status: She is alert and oriented to person, place, and time.  Psychiatric:        Mood and Affect: Mood normal.     ED Course/Procedures   Clinical Course as of Jan 04 1704  Wed Jan 04, 2019  1658 Patient passed p.o. challenge without difficulty.   [EH]    Clinical Course User Index [EH] Lorin Glass, PA-C    Procedures  MDM  Plan is to reassess, follow up on labs, PO challenge, Anticipate discharge home with anti-emetics.    Labs are reassuring without significant hematologic or electrolyte derangements.  Pregnancy test is negative.  COVID test has been obtained and is in progress.  Patient was reevaluated, she has been able to pass p.o. challenge without difficulty, states that she feels better and wishes to go home at this time.  We discussed appropriate quarantine and isolation.  She is given a prescription for Zofran and instructed to continue p.o. hydration at home.  Audrey Burch was evaluated in Emergency Department on 01/04/2019 for the symptoms described in the history of present illness. She was evaluated in the context of the global COVID-19 pandemic, which necessitated consideration that the patient might be at risk for infection with the SARS-CoV-2 virus that causes COVID-19. Institutional  protocols and algorithms that pertain to the evaluation of patients at risk for COVID-19 are in a state of rapid change based on information released by regulatory bodies including the CDC and federal and state organizations. These policies and algorithms were followed during the patient's care in the ED.  Return precautions were discussed with patient who states their understanding.  At the time of discharge patient denied any unaddressed complaints or concerns.  Patient is agreeable for discharge home.       Lorin Glass, PA-C 01/04/19 1707    Maudie Flakes, MD 01/07/19 0700

## 2019-01-04 NOTE — Discharge Instructions (Addendum)
Please use Tylenol as directed below.  If Tylenol does not control your symptoms then you may add in ibuprofen.  I have given you a prescription for nausea medicine.  It is very important that you stay well-hydrated.  If your symptoms worsen or you have any additional concerns please seek additional medical care and evaluation.  As we discussed even if your test comes back negative I am concerned that you may have coronavirus based on the combination of headache, fevers, nausea, vomiting, and diarrhea.  Please quarantine yourself for 10days from the onset of your symptoms even if your test comes back negative as it may be a false negative.  If your test does come back negative please be aware that you may have a true negative and may still be at risk of catching covid. If your symptoms worsen, you develop shortness of breath, or have additional concerns please seek additional medical care and evaluation.  Today you received medications that may make you sleepy or impair your ability to make decisions.  For the next 24 hours please do not drive, operate heavy machinery, care for a small child with out another adult present, or perform any activities that may cause harm to you or someone else if you were to fall asleep or be impaired.   You are being prescribed a medication which may make you sleepy. Please follow up of listed precautions for at least 24 hours after taking one dose.  If your work requires FMLA or other paperwork related to your medical leave of absence this needs to be filled out by a primary care doctor and will not be done by the emergency room.   Please take Ibuprofen (Advil, motrin) and Tylenol (acetaminophen) to relieve your pain.  You may take up to 600 MG (3 pills) of normal strength ibuprofen every 8 hours as needed.  In between doses of ibuprofen you make take tylenol, up to 1,000 mg (two extra strength pills).  Do not take more than 3,000 mg tylenol in a 24 hour period.  Please  check all medication labels as many medications such as pain and cold medications may contain tylenol.  Do not drink alcohol while taking these medications.  Do not take other NSAID'S while taking ibuprofen (such as aleve or naproxen).  Please take ibuprofen with food to decrease stomach upset.  The current recommendations from the CDC are that you quarantine until it has been at least 10 days since your first symptoms appeared and you have been 24 hours with no fever without the use of fever reducing medicines and all other symptoms of COVID-19 are improving.

## 2019-01-04 NOTE — Progress Notes (Signed)
Obstetrics and Gynecology New Patient Evaluation  Appointment Date: 01/04/2019  OBGYN Clinic: [facility]  Primary Care Provider: Department, [facility]  Referring Provider: Department, [place])  Chief Complaint: AUB, contraception management  History of Present Illness: Audrey Burch is a 23 y.o. Hispanic G0 (No LMP recorded. (Menstrual status: Other).), seen for the above chief complaint. Her past medical history is significant for BMI 40s, hypothyroidism, h/o insulin resistance  Patient seen by [facility] in august for heavy periods and had repeat TFTs, CBC, GC/CT testing and referred to me for birth control management and bleeding.   No current bleeding but continues to have AUB nearly every day, which is usually light. Last depo provera march/april 2019. She was put on depo for birth control when she got married and had aub and then stopped and she did ocps for one month with no effect seen. At her HD visit last month she was put on a short course of provera which she states stopped the bleeding. She states her tsh was still high and her synthroid increased to 75. She states all the other labs were normal.   Review of Systems:  as noted in the History of Present Illness.  Patient Active Problem List   Diagnosis Date Noted  . Hypovitaminosis D 04/30/2016  . 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 Hypocalcemia 03/20/2015  . 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 Acanthosis nigricans, acquired 07/10/2014  . Dyspepsia 07/10/2014    Past Medical History:  Past Medical History:  Diagnosis Date  . Asthma   . Thyroid disease     Past Surgical History:  Past Surgical History:  Procedure Laterality Date  . THYROIDECTOMY  2017  . TONSILLECTOMY      Past Obstetrical History:  OB History  Gravida Para Term Preterm AB Living  0 0 0 0 0 0  SAB TAB Ectopic Multiple Live Births  0 0 0  0 0    Past Gynecological History: As per HPI. Menarche age 44 Periods: she had a period that was about two weeks long and would come on every month or every other month and this was the case even after her thyroidectomy. AUB started after starting the depo. Last depo shot march/april 2019.  History of Pap Smear(s): No.  She is currently using no method for contraception.   Social History:  Social History   Socioeconomic History  . Marital status: Married    Spouse name: Not on file  . Number of children: Not on file  . Years of education: Not on file  . Highest education level: Not on file  Occupational History  . Not on file  Social Needs  . Financial resource strain: Not on file  . Food insecurity    Worry: Not on file    Inability: Not on file  . Transportation needs    Medical: Not on file    Non-medical: Not on file  Tobacco Use  . Smoking status: Never Smoker  . Smokeless tobacco: Never Used  Substance and Sexual Activity  . Alcohol use: No  . Drug use: No  . Sexual activity: Not on file    Comment: Depo  Lifestyle  . Physical activity    Days per week: Not on file    Minutes per session: Not on file  . Stress: Not on file  Relationships  . Social connections    Talks on phone: Not on file  Gets together: Not on file    Attends religious service: Not on file    Active member of club or organization: Not on file    Attends meetings of clubs or organizations: Not on file    Relationship status: Not on file  . Intimate partner violence    Fear of current or ex partner: Not on file    Emotionally abused: Not on file    Physically abused: Not on file    Forced sexual activity: Not on file  Other Topics Concern  . Not on file  Social History Narrative   ** Merged History Encounter **       ** Merged History Encounter **        Family History:  Family History  Problem Relation Age of Onset  . Cancer Maternal Grandmother   . Hypertension Maternal  Grandmother   . Diabetes Maternal Grandfather     Medications Neldon Newport had no medications administered during this visit. No current facility-administered medications for this visit.    Current Outpatient Medications  Medication Sig Dispense Refill  . albuterol (VENTOLIN HFA) 108 (90 Base) MCG/ACT inhaler Inhale 1 puff into the lungs every 6 (six) hours as needed for wheezing.    [name] ibuprofen (ADVIL) 800 MG tablet Take 800 mg by mouth every 8 (eight) hours as needed. for pain    . levothyroxine (SYNTHROID, LEVOTHROID) 50 MCG tablet Take 75 mcg by mouth at bedtime.     . medroxyPROGESTERone (PROVERA) 10 MG tablet Take 10 mg by mouth daily.    [name] acetaminophen (TYLENOL) 500 MG tablet Take 1,000 mg by mouth every 6 (six) hours as needed for mild pain.    . Calcium Carb-Cholecalciferol (CALCIUM 600+D) 600-800 MG-UNIT TABS Take 1 tablet by mouth daily.    . diphenhydrAMINE (SOMINEX) 25 MG tablet Take 50 mg by mouth at bedtime as needed for sleep.    [name] doxycycline (VIBRAMYCIN) 100 MG capsule Take 1 capsule (100 mg total) by mouth 2 (two) times daily. One po bid x 7 days (Patient not taking: Reported on 09/13/2018) 14 capsule 0  . fluconazole (DIFLUCAN) 150 MG tablet Please take one pill today and another one in 72 hours if your symptoms persist (Patient not taking: Reported on 01/04/2019) 2 tablet 0  . ketoconazole (NIZORAL) 2 % cream Apply to dry feet, in between each toe and to the entire foot once daily (Patient not taking: Reported on 09/13/2018) 30 g 0  . phenazopyridine (PYRIDIUM) 100 MG tablet Take 1 tablet (100 mg total) by mouth 3 (three) times daily as needed for pain. (Patient not taking: Reported on 09/13/2018) 10 tablet 0  . sulfamethoxazole-trimethoprim (BACTRIM DS) 800-160 MG tablet Take 1 tablet by mouth 2 (two) times daily. (Patient not taking: Reported on 06/13/2018) 6 tablet 0   Facility-Administered Medications Ordered in Other Visits  Medication Dose Route Frequency  Provider Last Rate Last Dose  . sodium chloride flush (NS) 0.9 % injection 3 mL  3 mL Intravenous Once Clifton James, MD        Allergies Vancomycin   Physical Exam:  BP 126/86   Pulse 80   Wt 225 lb 4.8 oz (102.2 kg)   BMI 42.57 kg/m  Body mass index is 42.57 kg/m. General appearance: Well nourished, well developed female in no acute distress.  Cardiovascular: normal s1 and s2.  No murmurs, rubs or gallops. Respiratory:  Clear to auscultation bilateral. Normal respiratory effort Abdomen: positive bowel sounds and  no masses, hernias; diffusely non tender to palpation, non distended Neuro/Psych:  Normal mood and affect.  Skin:  Warm and dry.  Lymphatic:  No inguinal lymphadenopathy.   Pelvic exam: is not limited by body habitus EGBUS: within normal limits, Vagina: within normal limits and with no blood or discharge in the vault, Cervix: normal appearing cervix without tenderness, discharge or lesions. Uterus:  nonenlarged and non tender and Adnexa:  normal adnexa and no mass, fullness, tenderness Rectovaginal: deferred  Laboratory: UPT negative  Radiology: none  Assessment: pt stable  Plan: 1. BMI 40.0-44.9, adult (HCC) See below  2. Post-surgical hypothyroidism  3. Insulin resistance  4. Cervical cancer screening Pap done today - Cytology - PAP( [place])  5. Contraception management/AUB D/w her that most likely her AUB now is from her abnormal thyroid function. She also has s/s suspicious for PCOS (BMI, insulin resistance, h/o oligomenorrhea).  I told her that I recommend getting her thyroid function back into the normal range. In the interim, I told her I recommend OCPs to help stop the AUB and try and give her some regularity with her bleeding or stop it. She has a h/o migraines and ?aura in talking to her so I told her I don't recommend estrogen containing options. I told her I recommend trying progestin only pills and instructions for use d/w her; pt  amenable to plan. I told her that once her TFTs are in the normal range for 3-9711m then she can come off the pills, if she wants, to see if her periods are normal, qmonth and regular. If not, then I would recommend u/s and PCOS evaluation. I also told her that in the interim that a 10% weight reduction may also help regulate her cycles. Pt is amenable to plan.    RTC PRN  Cornelia Copaharlie Tysen Roesler, Jr MD Attending [facility](Faculty Practice)

## 2019-01-04 NOTE — ED Triage Notes (Signed)
Pt reports 3 days of feeling like she having a fever, chills with a headache and n/v. Pt denies any pain.

## 2019-01-04 NOTE — Progress Notes (Signed)
Stopped Bleeding after taking provera. Stopped the Depo Shot after the second dosage. Megace ran out a week ago think she is bleeding again now and its her period.  PHQ-9 GAD7 elevated  Declined to speak with Roselyn Reef.

## 2019-01-04 NOTE — ED Notes (Signed)
Pt given water, gingerale and crackers.  

## 2019-01-04 NOTE — ED Provider Notes (Cosign Needed)
[facility]   CSN: [id number] Arrival date & time: 01/04/19  1109     History   Chief Complaint Chief Complaint  Patient presents with  . Fever  . Nausea    HPI Audrey Burch is a 23 y.o. female.     HPI Patient presents to the emergency department with nausea vomiting and diarrhea that started 2 days ago.  The patient states that she is had intermittent chills and thinks she may have a fever.  The patient states that she was concerned she may have eaten something that was bad.  Patient states that nothing seems make the condition better or worse.  Patient states she is unable to keep down fluids due to the vomiting.  The patient denies chest pain, shortness of breath, headache,blurred vision, neck pain, fever, cough, weakness, numbness, dizziness, anorexia, edema, abdominal pain, back pain, dysuria, hematemesis, bloody stool, near syncope, or syncope.  Patient states that she did have some hives on Monday.  But those seem to have resolved. Past Medical History:  Diagnosis Date  . Asthma   . Thyroid disease     Patient Active Problem List   Diagnosis Date Noted  . Hx of migraines 01/04/2019  . Hypovitaminosis D 04/30/2016  . 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 Hypocalcemia 03/20/2015  . 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 Acanthosis nigricans, acquired 07/10/2014  . Dyspepsia 07/10/2014    Past Surgical History:  Procedure Laterality Date  . THYROIDECTOMY  2017  . TONSILLECTOMY       OB History    Gravida  0   Para  0   Term  0   Preterm  0   AB  0   Living  0     SAB  0   TAB  0   Ectopic  0   Multiple  0   Live Births  0            Home Medications    Prior to Admission medications   Medication Sig Start Date End Date Taking? Authorizing Provider  acetaminophen (TYLENOL) 500 MG tablet Take 1,000 mg by mouth every 6 (six) hours as  needed for mild pain.    [provider]  albuterol (VENTOLIN HFA) 108 (90 Base) MCG/ACT inhaler Inhale 1 puff into the lungs every 6 (six) hours as needed for wheezing. 08/03/18   [provider]  Calcium Carb-Cholecalciferol (CALCIUM 600+D) 600-800 MG-UNIT TABS Take 1 tablet by mouth daily.    [provider]  diphenhydrAMINE (SOMINEX) 25 MG tablet Take 50 mg by mouth at bedtime as needed for sleep.    [provider]  doxycycline (VIBRAMYCIN) 100 MG capsule Take 1 capsule (100 mg total) by mouth 2 (two) times daily. One po bid x 7 days Patient not taking: Reported on 09/13/2018 06/13/18   Arthor CaptainHarris, Abigail, PA-C  fluconazole (DIFLUCAN) 150 MG tablet Please take one pill today and another one in 72 hours if your symptoms persist Patient not taking: Reported on 01/04/2019 09/13/18   Hedges, Tinnie GensJeffrey, PA-C  ibuprofen (ADVIL) 800 MG tablet Take 800 mg by mouth every 8 (eight) hours as needed. for pain 10/13/18   [provider]  ketoconazole (NIZORAL) 2 % cream Apply to dry feet, in between each toe and to the entire foot once daily Patient not taking: Reported on 09/13/2018 06/13/18   Arthor CaptainHarris, Abigail, PA-C  levothyroxine (SYNTHROID, LEVOTHROID) 50 MCG tablet Take 75  mcg by mouth at bedtime.  05/01/18   [provider]  medroxyPROGESTERone (PROVERA) 10 MG tablet Take 10 mg by mouth daily. 12/07/18   [provider]  norethindrone (CAMILA) 0.35 MG tablet Take 1 tablet (0.35 mg total) by mouth daily. 01/04/19   Aletha Halim, MD  phenazopyridine (PYRIDIUM) 100 MG tablet Take 1 tablet (100 mg total) by mouth 3 (three) times daily as needed for pain. Patient not taking: Reported on 09/13/2018 11/12/17   Poe, Deirdre C, CNM  sulfamethoxazole-trimethoprim (BACTRIM DS) 800-160 MG tablet Take 1 tablet by mouth 2 (two) times daily. Patient not taking: Reported on 06/13/2018 11/12/17   Lorene Dy, CNM    Family History Family History  Problem Relation  Age of Onset  . Cancer Maternal Grandmother   . Hypertension Maternal Grandmother   . Diabetes Maternal Grandfather     Social History Social History   Tobacco Use  . Smoking status: Never Smoker  . Smokeless tobacco: Never Used  Substance Use Topics  . Alcohol use: No  . Drug use: No     Allergies   Vancomycin   Review of Systems Review of Systems All other systems negative except as documented in the HPI. All pertinent positives and negatives as reviewed in the HPI.  Physical Exam Updated Vital Signs BP 135/89   Pulse 81   Temp 98.7 F (37.1 C) (Oral)   Resp 18   SpO2 98%   Physical Exam Vitals signs and nursing note reviewed.  Constitutional:      General: She is not in acute distress.    Appearance: She is well-developed.  HENT:     Head: Normocephalic and atraumatic.  Eyes:     Pupils: Pupils are equal, round, and reactive to light.  Neck:     Musculoskeletal: Normal range of motion and neck supple.  Cardiovascular:     Rate and Rhythm: Normal rate and regular rhythm.     Heart sounds: Normal heart sounds. No murmur. No friction rub. No gallop.   Pulmonary:     Effort: Pulmonary effort is normal. No respiratory distress.     Breath sounds: Normal breath sounds. No wheezing.  Abdominal:     General: Bowel sounds are normal. There is no distension.     Palpations: Abdomen is soft.     Tenderness: There is no abdominal tenderness.  Skin:    General: Skin is warm and dry.     Capillary Refill: Capillary refill takes less than 2 seconds.     Findings: No erythema or rash.  Neurological:     Mental Status: She is alert and oriented to person, place, and time.     Motor: No abnormal muscle tone.     Coordination: Coordination normal.  Psychiatric:        Behavior: Behavior normal.      ED Treatments / Results  Labs (all labs ordered are listed, but only abnormal results are displayed) Labs Reviewed  COMPREHENSIVE METABOLIC PANEL - Abnormal;  Notable for the following components:      Result Value   Glucose, Bld 109 (*)    Alkaline Phosphatase 37 (*)    All other components within normal limits  CBC - Abnormal; Notable for the following components:   RBC 5.32 (*)    All other components within normal limits  URINALYSIS, ROUTINE W REFLEX MICROSCOPIC - Abnormal; Notable for the following components:   Hgb urine dipstick LARGE (*)    Protein, ur 30 (*)  All other components within normal limits  SARS CORONAVIRUS 2 (TAT 6-24 HRS)  LIPASE, BLOOD  I-STAT BETA HCG BLOOD, ED ([facility], [facility], [facility] ONLY)    EKG None  Radiology No results found.  Procedures Procedures (including critical care time)  Medications Ordered in ED Medications  sodium chloride flush (NS) 0.9 % injection 3 mL (has no administration in time range)  sodium chloride 0.9 % bolus 1,000 mL (has no administration in time range)  promethazine (PHENERGAN) injection 25 mg (has no administration in time range)  diphenhydrAMINE (BENADRYL) injection 25 mg (has no administration in time range)     Initial Impression / Assessment and Plan / ED Course  I have reviewed the triage vital signs and the nursing notes.  Pertinent labs & imaging results that were available during my care of the patient were reviewed by me and considered in my medical decision making (see chart for details).       Patient most likely has a gastroenteritis based on her symptoms and HPI.  Patient will be given IV fluids and antiemetics.  Patient has been otherwise stable here in the emergency department.  Final Clinical Impressions(s) / ED Diagnoses   Final diagnoses:  None    ED Discharge Orders    None       Charlestine Night, PA-C 01/04/19 1500

## 2019-01-05 LAB — SARS CORONAVIRUS 2 (TAT 6-24 HRS): SARS Coronavirus 2: NEGATIVE

## 2019-01-09 LAB — CYTOLOGY - PAP: Diagnosis: NEGATIVE

## 2019-09-12 ENCOUNTER — Telehealth: Payer: Self-pay | Admitting: Lactation Services

## 2019-09-12 NOTE — Telephone Encounter (Signed)
Called patient to discuss her concerns about stopping birthcontrol. Patient did not answer. LM For patient to call the office. Will send My Chart.

## 2020-09-17 ENCOUNTER — Ambulatory Visit (INDEPENDENT_AMBULATORY_CARE_PROVIDER_SITE_OTHER): Payer: Self-pay

## 2020-09-17 VITALS — BP 138/74 | HR 89 | Wt 236.4 lb

## 2020-09-17 DIAGNOSIS — Z3202 Encounter for pregnancy test, result negative: Secondary | ICD-10-CM

## 2020-09-17 LAB — POCT PREGNANCY, URINE: Preg Test, Ur: NEGATIVE

## 2020-09-17 NOTE — Progress Notes (Signed)
Chart reviewed for nurse visit. Agree with plan of care.   Judeth Horn, NP 09/17/2020 9:17 AM

## 2020-09-17 NOTE — Progress Notes (Signed)
Pt here today for pregnancy test that resulted negative.  Pt reports that she is a week late on her period, took a home pregnancy, and could not tell if there was a faint line or not.  Pt states that she just wants to be sure.  Medications/allergies reviewed.  Encouraged pt to test weekly until she has a positive home test or she gets her period as stress can delay her period.  I also advised pt that if her period has not come in a couple months with negative home tests to please call the office to schedule an appt with a provider.  Pt verbalized understanding with no further questions.   Addison Naegeli, RN 09/17/20

## 2021-03-21 ENCOUNTER — Other Ambulatory Visit: Payer: Self-pay

## 2021-03-21 ENCOUNTER — Ambulatory Visit (HOSPITAL_COMMUNITY)
Admission: EM | Admit: 2021-03-21 | Discharge: 2021-03-21 | Disposition: A | Discharge status: Home / Self Care | Payer: Medicaid Other

## 2021-03-21 ENCOUNTER — Encounter (HOSPITAL_COMMUNITY): Payer: Self-pay

## 2021-03-21 DIAGNOSIS — R079 Chest pain, unspecified: Secondary | ICD-10-CM

## 2021-03-21 HISTORY — DX: Anxiety disorder, unspecified: F41.9

## 2021-03-21 NOTE — Discharge Instructions (Signed)
Please follow up with your PCP. You may be a great candidate for a Holter monitor or Zio Patch. Please report to ED with any worsening symptoms.

## 2021-03-21 NOTE — ED Provider Notes (Signed)
[facility]    CSN: [id number] Arrival date & time: 03/21/21  1103      History   Chief Complaint Chief Complaint  Patient presents with   Chest Pain    HPI Audrey Burch is a 25 y.o. female.   Patient here today for evaluation of left-sided chest pain that is been intermittent over the last few days.  She reports that pain at times will feel sharp and other times of feels pressure.  She has felt more stressed recently and is not sure if it is related to anxiety.  She denies any shortness of breath.  At time she had said some nausea but she reports that she is also had vertigo and not sure if it is related to this.  The history is provided by the patient.  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 Authorizing Provider  escitalopram (LEXAPRO) 10 MG tablet Take 10 mg by mouth daily.   Yes [provider]  ferrous sulfate 325 (65 FE) MG EC tablet Take 325 mg by mouth 3 (three) times daily with meals.   Yes [provider]  albuterol (VENTOLIN HFA) 108 (90 Base) MCG/ACT inhaler Inhale 1 puff into  the lungs every 6 (six) hours as needed for wheezing. 08/03/18   [provider]  ibuprofen (ADVIL) 200 MG tablet Take 400 mg by mouth every 6 (six) hours as needed for headache.    [provider]  levothyroxine (SYNTHROID, LEVOTHROID) 50 MCG tablet Take 100 mcg by mouth at bedtime. 05/01/18   [provider]  metFORMIN (GLUCOPHAGE) 500 MG tablet Take 500 mg by mouth 2 (two) times daily with a meal.    [provider]  QUEtiapine Fumarate (SEROQUEL PO) Take 1 tablet by mouth daily.    [provider]    Family History Family History  Problem Relation Age of Onset   Cancer Maternal Grandmother    Hypertension Maternal Grandmother    Diabetes Maternal Grandfather     Social History Social History   Tobacco Use   Smoking status: Never   Smokeless tobacco: Never  Substance Use Topics   Alcohol use: No   Drug use: No     Allergies   Vancomycin   Review of Systems Review of Systems  Constitutional:  Negative for chills and  fever.  Eyes:  Negative for discharge and redness.  Respiratory:  Negative for shortness of breath.   Cardiovascular:  Positive for chest pain.  Gastrointestinal:  Negative for abdominal pain, nausea and vomiting.  Genitourinary:  Positive for vaginal bleeding and vaginal discharge.    Physical Exam Triage Vital Signs ED Triage Vitals  Enc Vitals Group     BP 03/21/21 1252 117/73     Pulse Rate 03/21/21 1252 70     Resp 03/21/21 1252 17     Temp 03/21/21 1252 97.8 F (36.6 C)     Temp Source 03/21/21 1252 Oral     SpO2 03/21/21 1252 100 %     Weight --      Height --      Head Circumference --      Peak Flow --      Pain Score 03/21/21 1250 8     Pain Loc --      Pain Edu? --      Excl. in [place]? --    No data found.  Updated Vital Signs BP 117/73 (BP Location: Right Arm)   Pulse 70   Temp 97.8 F (36.6 C) (Oral)   Resp 17   SpO2 100%      Physical Exam Vitals and nursing note reviewed.   Constitutional:      General: She is not in acute distress.    Appearance: Normal appearance. She is not ill-appearing.  HENT:     Head: Normocephalic and atraumatic.  Eyes:     Conjunctiva/sclera: Conjunctivae normal.  Cardiovascular:     Rate and Rhythm: Normal rate and regular rhythm.     Pulses: Normal pulses.  Pulmonary:     Effort: Pulmonary effort is normal. No respiratory distress.     Breath sounds: Normal breath sounds. No wheezing, rhonchi or rales.  Chest:     Chest wall: No tenderness.  Neurological:     Mental Status: She is alert.  Psychiatric:        Mood and Affect: Mood normal.        Behavior: Behavior normal.        Thought Content: Thought content normal.     UC Treatments / Results  Labs (all labs ordered are listed, but only abnormal results are displayed) Labs Reviewed - No data to display  EKG   Radiology No results found.  Procedures Procedures (including critical care time)  Medications Ordered in UC Medications - No data to display  Initial Impression / Assessment and Plan / UC Course  I have reviewed the triage vital signs and the nursing notes.  Pertinent labs & imaging results that were available during my care of the patient were reviewed by me and considered in my medical decision making (see chart for details).    EKG with normal sinus rhythm ventricular rate of 67 bpm.  Very low suspicion for cardiac etiology of symptoms but recommended follow-up with as patient may be a good candidate for Holter monitor.  Discussed possible anxiety as a trigger for symptoms and again recommended follow-up with PCP for same as medication adjustment might be necessary.  Patient is agreeable with plan.  I did encourage her to report to the ED with any worsening chest pain or other concerning symptoms.  Final Clinical Impressions(s) / UC Diagnoses   Final diagnoses:  Chest pain, unspecified type     Discharge Instructions      Please follow  up with your PCP. You  may be a great candidate for a Holter monitor or Zio Patch. Please report to ED with any worsening symptoms.    ED Prescriptions   None    PDMP not reviewed this encounter.   Francene Finders, PA-C 03/21/21 1358

## 2021-03-21 NOTE — ED Triage Notes (Signed)
Pt present with intermittent chest pain on left side that she describes as a pressure.

## 2021-06-03 ENCOUNTER — Emergency Department (HOSPITAL_COMMUNITY)
Admission: EM | Admit: 2021-06-03 | Discharge: 2021-06-03 | Disposition: A | Discharge status: Home / Self Care | Payer: Self-pay | Attending: Emergency Medicine | Admitting: Emergency Medicine

## 2021-06-03 ENCOUNTER — Emergency Department (HOSPITAL_COMMUNITY): Payer: Self-pay

## 2021-06-03 ENCOUNTER — Other Ambulatory Visit: Payer: Self-pay

## 2021-06-03 DIAGNOSIS — Z20822 Contact with and (suspected) exposure to covid-19: Secondary | ICD-10-CM | POA: Insufficient documentation

## 2021-06-03 DIAGNOSIS — N390 Urinary tract infection, site not specified: Secondary | ICD-10-CM | POA: Insufficient documentation

## 2021-06-03 DIAGNOSIS — D72829 Elevated white blood cell count, unspecified: Secondary | ICD-10-CM | POA: Insufficient documentation

## 2021-06-03 DIAGNOSIS — J069 Acute upper respiratory infection, unspecified: Secondary | ICD-10-CM | POA: Insufficient documentation

## 2021-06-03 LAB — CBC WITH DIFFERENTIAL/PLATELET
Abs Immature Granulocytes: 0.09 10*3/uL — ABNORMAL HIGH (ref 0.00–0.07)
Basophils Absolute: 0 10*3/uL (ref 0.0–0.1)
Basophils Relative: 0 %
Eosinophils Absolute: 0.4 10*3/uL (ref 0.0–0.5)
Eosinophils Relative: 3 %
HCT: 40.6 % (ref 36.0–46.0)
Hemoglobin: 12.3 g/dL (ref 12.0–15.0)
Immature Granulocytes: 1 %
Lymphocytes Relative: 14 %
Lymphs Abs: 1.7 10*3/uL (ref 0.7–4.0)
MCH: 22.2 pg — ABNORMAL LOW (ref 26.0–34.0)
MCHC: 30.3 g/dL (ref 30.0–36.0)
MCV: 73.2 fL — ABNORMAL LOW (ref 80.0–100.0)
Monocytes Absolute: 0.7 10*3/uL (ref 0.1–1.0)
Monocytes Relative: 6 %
Neutro Abs: 8.9 10*3/uL — ABNORMAL HIGH (ref 1.7–7.7)
Neutrophils Relative %: 76 %
Platelets: 317 10*3/uL (ref 150–400)
RBC: 5.55 MIL/uL — ABNORMAL HIGH (ref 3.87–5.11)
RDW: 17.5 % — ABNORMAL HIGH (ref 11.5–15.5)
WBC: 11.8 10*3/uL — ABNORMAL HIGH (ref 4.0–10.5)
nRBC: 0.4 % — ABNORMAL HIGH (ref 0.0–0.2)

## 2021-06-03 LAB — POC URINE PREG, ED: Preg Test, Ur: NEGATIVE

## 2021-06-03 LAB — COMPREHENSIVE METABOLIC PANEL
ALT: 28 U/L (ref 0–44)
AST: 23 U/L (ref 15–41)
Albumin: 4.2 g/dL (ref 3.5–5.0)
Alkaline Phosphatase: 42 U/L (ref 38–126)
Anion gap: 11 (ref 5–15)
BUN: 6 mg/dL (ref 6–20)
CO2: 26 mmol/L (ref 22–32)
Calcium: 8.5 mg/dL — ABNORMAL LOW (ref 8.9–10.3)
Chloride: 99 mmol/L (ref 98–111)
Creatinine, Ser: 0.72 mg/dL (ref 0.44–1.00)
GFR, Estimated: >60 mL/min (ref >60)
Glucose, Bld: 108 mg/dL — ABNORMAL HIGH (ref 70–99)
Potassium: 3.5 mmol/L (ref 3.5–5.1)
Sodium: 136 mmol/L (ref 135–145)
Total Bilirubin: 0.5 mg/dL (ref 0.3–1.2)
Total Protein: 7.7 g/dL (ref 6.5–8.1)

## 2021-06-03 LAB — URINALYSIS, ROUTINE W REFLEX MICROSCOPIC
Bilirubin Urine: NEGATIVE
Glucose, UA: NEGATIVE mg/dL
Ketones, ur: NEGATIVE mg/dL
Nitrite: NEGATIVE
Protein, ur: NEGATIVE mg/dL
Specific Gravity, Urine: 1.015 (ref 1.005–1.030)
pH: 6 (ref 5.0–8.0)

## 2021-06-03 LAB — RESP PANEL BY RT-PCR (FLU A&B, COVID) ARPGX2
Influenza A by PCR: NEGATIVE
Influenza B by PCR: NEGATIVE
SARS Coronavirus 2 by RT PCR: NEGATIVE

## 2021-06-03 LAB — GROUP A STREP BY PCR: Group A Strep by PCR: NOT DETECTED

## 2021-06-03 MED ORDER — SODIUM CHLORIDE 0.9 % IV BOLUS
500.0000 mL | Freq: Once | INTRAVENOUS | Status: AC
  Administered 2021-06-03: 500 mL via INTRAVENOUS

## 2021-06-03 MED ORDER — IBUPROFEN 800 MG PO TABS
800.0000 mg | ORAL_TABLET | Freq: Once | ORAL | Status: AC
  Administered 2021-06-03: 800 mg via ORAL
  Filled 2021-06-03: qty 1

## 2021-06-03 MED ORDER — CEPHALEXIN 500 MG PO CAPS: 500.0000 mg | ORAL_CAPSULE | Freq: Four times a day (QID) | ORAL | 0 refills | Status: DC

## 2021-06-03 NOTE — ED Provider Notes (Signed)
[facility] EMERGENCY DEPARTMENT Provider Note   CSN: [id number] Arrival date & time: 06/03/21  0849     History  Chief Complaint  Patient presents with   Sore Throat   Cough   Nasal Congestion   Hematuria    Audrey Burch is a 26 y.o. female.  Presented to the emergency room with concern for sore throat, cough, congestion and some shortness of breath.  Patient reports that her symptoms started a couple days ago.  Initially just had sore throat but then developed a "and today has felt more short of breath and fatigue.  Endorses general malaise.  Has had general body aches as well.  Some chest discomfort, mostly with coughing.  Reports that she does have family members with similar symptoms at home.  She is not aware of a fever at home but has had some chills.  Also noted slight amount of blood in her urine.  No pain with urination.  HPI     Home Medications Prior to Admission medications   Medication Sig Start Date End Date Taking? Authorizing Provider  cephALEXin (KEFLEX) 500 MG capsule Take 1 capsule (500 mg total) by mouth 4 (four) times daily. 06/03/21  Yes Lucrezia Starch, MD  albuterol (VENTOLIN HFA) 108 (90 Base) MCG/ACT inhaler Inhale 1 puff into the lungs every 6 (six) hours as needed for wheezing. 08/03/18   [provider]  escitalopram (LEXAPRO) 10 MG tablet Take 10 mg by mouth daily.    [provider]  ferrous sulfate 325 (65 FE) MG EC tablet Take 325 mg by mouth 3 (three) times daily with meals.    [provider]  ibuprofen (ADVIL) 200 MG tablet Take 400 mg by mouth every 6 (six) hours as needed for headache.    [provider]  levothyroxine (SYNTHROID, LEVOTHROID) 50 MCG tablet Take 100 mcg by mouth at bedtime. 05/01/18   [provider]  metFORMIN (GLUCOPHAGE) 500 MG tablet Take 500 mg by mouth 2 (two) times daily with a meal.    [provider]  QUEtiapine Fumarate (SEROQUEL PO) Take  1 tablet by mouth daily.    [provider]      Allergies    Vancomycin    Review of Systems   Review of Systems  Constitutional:  Positive for chills and fatigue. Negative for fever.  HENT:  Positive for congestion. Negative for ear pain and sore throat.   Eyes:  Negative for pain and visual disturbance.  Respiratory:  Positive for cough. Negative for shortness of breath.   Cardiovascular:  Negative for chest pain and palpitations.  Gastrointestinal:  Negative for abdominal pain and vomiting.  Genitourinary:  Negative for dysuria and hematuria.  Musculoskeletal:  Positive for arthralgias and myalgias. Negative for back pain.  Skin:  Negative for color change and rash.  Neurological:  Negative for seizures and syncope.  All other systems reviewed and are negative.  Physical Exam Updated Vital Signs BP 137/87 (BP Location: Right Arm)    Pulse 96    Temp 100.3 F (37.9 C) (Oral)    Resp 16    Ht 5\' 1"  (1.549 m)    Wt 104.3 kg    LMP  (LMP Unknown) Comment: Pt states she has had a recent negative pregnancy test   SpO2 98%    BMI 43.46 kg/m  Physical Exam Vitals and nursing note reviewed.  Constitutional:      General: She is not in acute distress.  Appearance: She is well-developed.  HENT:     Head: Normocephalic and atraumatic.     Mouth/Throat:     Mouth: Mucous membranes are moist.     Pharynx: Oropharynx is clear. Uvula midline. No oropharyngeal exudate or posterior oropharyngeal erythema.  Eyes:     Conjunctiva/sclera: Conjunctivae normal.  Cardiovascular:     Rate and Rhythm: Normal rate and regular rhythm.     Heart sounds: No murmur heard. Pulmonary:     Effort: Pulmonary effort is normal. No respiratory distress.     Breath sounds: Normal breath sounds.  Abdominal:     Palpations: Abdomen is soft.     Tenderness: There is no abdominal tenderness.  Musculoskeletal:        General: No swelling.     Cervical back: Neck supple.  Skin:    General: Skin  is warm and dry.     Capillary Refill: Capillary refill takes less than 2 seconds.  Neurological:     Mental Status: She is alert.  Psychiatric:        Mood and Affect: Mood normal.    ED Results / Procedures / Treatments   Labs (all labs ordered are listed, but only abnormal results are displayed) Labs Reviewed  URINALYSIS, ROUTINE W REFLEX MICROSCOPIC - Abnormal; Notable for the following components:      Result Value   APPearance HAZY (*)    Hgb urine dipstick LARGE (*)    Leukocytes,Ua SMALL (*)    Bacteria, UA MANY (*)    All other components within normal limits  CBC WITH DIFFERENTIAL/PLATELET - Abnormal; Notable for the following components:   WBC 11.8 (*)    RBC 5.55 (*)    MCV 73.2 (*)    MCH 22.2 (*)    RDW 17.5 (*)    nRBC 0.4 (*)    Neutro Abs 8.9 (*)    Abs Immature Granulocytes 0.09 (*)    All other components within normal limits  COMPREHENSIVE METABOLIC PANEL - Abnormal; Notable for the following components:   Glucose, Bld 108 (*)    Calcium 8.5 (*)    All other components within normal limits  RESP PANEL BY RT-PCR (FLU A&B, COVID) ARPGX2  GROUP A STREP BY PCR  POC URINE PREG, ED    EKG EKG Interpretation  Date/Time:  Tuesday June 03 2021 10:50:31 EST Ventricular Rate:  100 PR Interval:  121 QRS Duration: 90 QT Interval:  326 QTC Calculation: 421 R Axis:   79 Text Interpretation: Sinus tachycardia Confirmed by Madalyn Rob [phone]) on 06/03/2021 11:13:44 AM  Radiology DG Chest 2 View  Result Date: 06/03/2021 CLINICAL DATA:  Shortness of breath, cough, concern for pneumonia EXAM: CHEST - 2 VIEW COMPARISON:  Chest radiograph dated June 19, 2018 FINDINGS: The heart size and mediastinal contours are within normal limits. Both lungs are clear. The visualized skeletal structures are unremarkable. IMPRESSION: No active cardiopulmonary disease. Electronically Signed   By: Keane Police D.O.   On: 06/03/2021 11:21    Procedures Procedures     Medications Ordered in ED Medications  ibuprofen (ADVIL) tablet 800 mg (800 mg Oral Given 06/03/21 1057)  sodium chloride 0.9 % bolus 500 mL (0 mLs Intravenous Stopped 06/03/21 1153)    ED Course/ Medical Decision Making/ A&P                           Medical Decision Making Amount and/or Complexity of Data Reviewed Labs:  ordered. Radiology: ordered.  Risk Prescription drug management.   26 year old lady presenting to the emergency room with concern for cough, congestion and shortness of breath as well as significant general fatigue.  Does endorse history of diabetes.  Not on insulin.  Given her reported symptoms, will check basic laboratory work.  Her COVID and flu testing is negative.  Her chest x-ray is independently reviewed, no obvious infiltrate to suggest pneumonia.  Suspect most likely viral upper respiratory illness.  Basic lab work noted slight leukocytosis but otherwise grossly stable.  No AKI or electrolyte derangement.  UA with possible UTI.  Given her low-grade fever and chills, will treat for possible UTI with course of cephalexin.  Recommend she follow-up with her primary care doctor.  Tolerating p.o., stable vitals, do not feel she needs inpatient admission for her likely viral upper respiratory illness with possible urinary tract infection.  Reviewed return precautions and discharged home.    After the discussed management above, the patient was determined to be safe for discharge.  The patient was in agreement with this plan and all questions regarding their care were answered.  ED return precautions were discussed and the patient will return to the ED with any significant worsening of condition.         Final Clinical Impression(s) / ED Diagnoses Final diagnoses:  Viral upper respiratory illness  Urinary tract infection without hematuria, site unspecified    Rx / DC Orders ED Discharge Orders          Ordered    cephALEXin (KEFLEX) 500 MG capsule  4  times daily        06/03/21 1223              Lucrezia Starch, MD 06/03/21 1245

## 2021-06-03 NOTE — Progress Notes (Signed)
While rounding I spoke with Patient to see if she could use or needed Chaplain services . She indicated that she ok for the moment..  Provided presence and emotional support.  Chaplain available  as needed.  Jaclynn Major, [place], [facility], Pager [phone]

## 2021-06-03 NOTE — ED Triage Notes (Signed)
Pt. Stated, I woke up 2 days ago with sore throat, runny nose and cough, back pain.

## 2021-06-03 NOTE — Discharge Instructions (Signed)
Recommend taking antibiotic for possible UTI.  Take Tylenol and Motrin as needed for body aches and fevers.  If you develop worsening pain, difficulty breathing, fevers or vomiting or other new concerning symptom, come back to ER for reassessment.

## 2023-04-21 ENCOUNTER — Encounter (HOSPITAL_COMMUNITY): Payer: Self-pay | Admitting: Emergency Medicine

## 2023-04-21 ENCOUNTER — Other Ambulatory Visit: Payer: Self-pay

## 2023-04-21 ENCOUNTER — Ambulatory Visit (HOSPITAL_COMMUNITY)
Admission: EM | Admit: 2023-04-21 | Discharge: 2023-04-21 | Disposition: A | Discharge status: Home / Self Care | Payer: Medicaid Other | Attending: Physician Assistant | Admitting: Physician Assistant

## 2023-04-21 DIAGNOSIS — R197 Diarrhea, unspecified: Secondary | ICD-10-CM

## 2023-04-21 DIAGNOSIS — K529 Noninfective gastroenteritis and colitis, unspecified: Secondary | ICD-10-CM

## 2023-04-21 DIAGNOSIS — R110 Nausea: Secondary | ICD-10-CM | POA: Diagnosis not present

## 2023-04-21 LAB — POCT URINALYSIS DIP (MANUAL ENTRY)
Bilirubin, UA: NEGATIVE
Glucose, UA: NEGATIVE mg/dL
Ketones, POC UA: NEGATIVE mg/dL
Leukocytes, UA: NEGATIVE
Nitrite, UA: NEGATIVE
Protein Ur, POC: 30 mg/dL — AB
Spec Grav, UA: 1.025 (ref 1.010–1.025)
Urobilinogen, UA: 0.2 U/dL
pH, UA: 6 (ref 5.0–8.0)

## 2023-04-21 LAB — POCT URINE PREGNANCY: Preg Test, Ur: NEGATIVE

## 2023-04-21 MED ORDER — ONDANSETRON 4 MG PO TBDP
ORAL_TABLET | ORAL | Status: AC
  Filled 2023-04-21: qty 1

## 2023-04-21 MED ORDER — ONDANSETRON 4 MG PO TBDP: 4.0000 mg | ORAL_TABLET | Freq: Three times a day (TID) | ORAL | 0 refills | Status: DC | PRN

## 2023-04-21 MED ORDER — ONDANSETRON 4 MG PO TBDP
4.0000 mg | ORAL_TABLET | Freq: Once | ORAL | Status: AC
  Administered 2023-04-21: 4 mg via ORAL

## 2023-04-21 NOTE — ED Provider Notes (Signed)
 [facility]    CSN: [id number] Arrival date & time: 04/21/23  1745      History   Chief Complaint Chief Complaint  Patient presents with   Diarrhea   Emesis    HPI Audrey Burch is a 28 y.o. female.   Patient presents today with a 2-day history of GI symptoms.  She reports abdominal upset, significant nausea, diarrhea.  She has not had any vomiting.  She denies any significant abdominal pain but just reports intermittent cramping before she has a bowel movement.  Her last bowel movement was approximately an hour ago.  She has had a small amount of blood on the toilet tissue but denies any hematochezia or melena.  She has not eaten anything suspicious and denies any recent dietary changes.  Denies any recent antibiotics, medication changes, recent travel.  Does report that her nephew visited her and he had a GI bug as well.  She denies any fever or urinary symptoms.  No concern for pregnancy.  Denies history of gastrointestinal disorder.  Denies previous abdominal surgery including cholecystectomy or appendectomy.  She has missed work as a result of symptoms and is requesting a work excuse note.    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 Authorizing Provider  ondansetron  (ZOFRAN -ODT) 4 MG disintegrating tablet Take 1 tablet (4 mg total) by mouth every 8 (eight) hours as needed for nausea or vomiting. 04/21/23  Yes Jezel Basto K, PA-C  albuterol (VENTOLIN HFA) 108 (90 Base) MCG/ACT inhaler Inhale 1 puff into the lungs every 6 (six) hours as needed for wheezing. 08/03/18   [provider]  escitalopram (LEXAPRO) 10 MG tablet Take 10 mg by mouth daily.    [provider]  ferrous sulfate 325 (65 FE) MG EC tablet Take 325 mg by mouth 3 (three) times daily with meals.    [provider]  ibuprofen  (ADVIL ) 200 MG tablet Take 400 mg by mouth every 6 (six) hours as needed for headache.    [provider]  levothyroxine  (SYNTHROID , LEVOTHROID) 50 MCG tablet Take 100 mcg by mouth at bedtime. 05/01/18   [provider]  metFORMIN (GLUCOPHAGE) 500 MG tablet Take 500 mg by mouth 2 (two) times daily with a meal.    [provider]  QUEtiapine Fumarate (SEROQUEL PO) Take 1 tablet by mouth daily.    [provider]    Family History Family History  Problem Relation Age  of Onset   Cancer Maternal Grandmother    Hypertension Maternal Grandmother    Diabetes Maternal Grandfather     Social History Social History   Tobacco Use   Smoking status: Never   Smokeless tobacco: Never  Vaping Use   Vaping status: Never Used  Substance Use Topics   Alcohol use: No   Drug use: Yes    Types: Marijuana     Allergies   Vancomycin    Review of Systems Review of Systems  Constitutional:  Positive for activity change and chills. Negative for appetite change, fatigue and fever.  Respiratory:  Negative for cough and shortness of breath.   Cardiovascular:  Negative for chest pain.  Gastrointestinal:  Positive for abdominal pain (cramping), diarrhea and nausea. Negative for blood in stool, constipation and vomiting.  Genitourinary:  Negative for dysuria, frequency, urgency,  vaginal bleeding, vaginal discharge and vaginal pain.  Musculoskeletal:  Positive for arthralgias and myalgias.     Physical Exam Triage Vital Signs ED Triage Vitals [04/21/23 1814]  Encounter Vitals Group     BP 124/86     Systolic BP Percentile      Diastolic BP Percentile      Pulse Rate 95     Resp 18     Temp 98.1 F (36.7 C)     Temp Source Oral     SpO2 99 %     Weight      Height      Head Circumference      Peak Flow      Pain Score      Pain Loc      Pain Education      Exclude from Growth Chart    No data found.  Updated Vital Signs BP 124/86 (BP Location: Right Arm)   Pulse 95   Temp 98.1 F (36.7 C) (Oral)   Resp 18   LMP 04/19/2023   SpO2 99%   Visual Acuity Right Eye Distance:   Left Eye Distance:   Bilateral Distance:    Right Eye Near:   Left Eye Near:    Bilateral Near:     Physical Exam Vitals reviewed.  Constitutional:      General: She is awake. She is not in acute distress.    Appearance: Normal appearance. She is well-developed. She is not ill-appearing.     Comments: Very pleasant female appears stated age in no acute distress sitting comfortably in exam room  HENT:     Head: Normocephalic and atraumatic.     Mouth/Throat:     Mouth: Mucous membranes are moist.     Pharynx: Uvula midline. No oropharyngeal exudate or posterior oropharyngeal erythema.  Cardiovascular:     Rate and Rhythm: Normal rate and regular rhythm.     Heart sounds: Normal heart sounds, S1 normal and S2 normal. No murmur heard. Pulmonary:     Effort: Pulmonary effort is normal.     Breath sounds: Normal breath sounds. No wheezing, rhonchi or rales.     Comments: Clear to auscultation bilaterally Abdominal:     General: Bowel sounds are normal.     Palpations: Abdomen is soft.     Tenderness: There is generalized abdominal tenderness. There is no right CVA tenderness, left CVA tenderness, guarding or rebound.     Comments: Generalized tenderness throughout  abdomen.  No focal abdominal pain or evidence of acute abdomen on physical exam.  No CVA tenderness.  Psychiatric:  Behavior: Behavior is cooperative.      UC Treatments / Results  Labs (all labs ordered are listed, but only abnormal results are displayed) Labs Reviewed  POCT URINALYSIS DIP (MANUAL ENTRY) - Abnormal; Notable for the following components:      Result Value   Clarity, UA cloudy (*)    Blood, UA small (*)    Protein Ur, POC =30 (*)    All other components within normal limits  POCT URINE PREGNANCY    EKG   Radiology No results found.  Procedures Procedures (including critical care time)  Medications Ordered in UC Medications  ondansetron  (ZOFRAN -ODT) disintegrating tablet 4 mg (4 mg Oral Given 04/21/23 1839)    Initial Impression / Assessment and Plan / UC Course  I have reviewed the triage vital signs and the nursing notes.  Pertinent labs & imaging results that were available during my care of the patient were reviewed by me and considered in my medical decision making (see chart for details).     Patient is well-appearing, afebrile, nontoxic, nontachycardic.  Vital signs and physical exam are reassuring with no indication for emergent evaluation or imaging.  Urine pregnancy was negative.  UA had trace blood but patient is finishing her menstrual cycle.  There is no evidence of dehydration or significant infection.  Suspect viral gastroenteritis medically given her nephew was sick with similar symptoms.  Discussed that generally this is self-limited.  She was given Zofran  in clinic with improvement of symptoms and sent home with this medication to be used every 8 hours as needed for nausea and vomiting symptoms.  She is to eat a bland diet and push fluids particularly bland electrolyte solutions such as Pedialyte.  She can use over-the-counter medication such as Pepto-Bismol to help manage her diarrhea.  We discussed that if her symptoms are not improving  within a few days or if she has any worsening symptoms including severe abdominal pain, focal abdominal pain, nausea/vomiting despite medication, hematemesis, melena, hematochezia, worsening diarrhea she needs to be seen immediately.  Strict return precautions given.  Work excuse note provided.  Final Clinical Impressions(s) / UC Diagnoses   Final diagnoses:  Gastroenteritis  Nausea without vomiting  Diarrhea, unspecified type     Discharge Instructions      I believe the you have a stomach bug.  Use Zofran  every 8 hours as needed for nausea and vomiting symptoms.  Eat a bland diet such as the brat diet (bananas, rice, applesauce, toast).  I recommend over-the-counter medications to help including Pepto-Bismol.  Make sure that you are drinking plenty of fluid such as Pedialyte to ensure that you are not getting dehydrated.  If your symptoms are not improving within a few days please return for reevaluation.  If anything worsens and you have severe abdominal pain, more than 10 bowel movements per 24 hours, fever, blood in your stool, nausea/vomiting interfering with oral intake, nausea/vomiting despite the medication you need to be seen immediately.     ED Prescriptions     Medication Sig Dispense Auth. Provider   ondansetron  (ZOFRAN -ODT) 4 MG disintegrating tablet Take 1 tablet (4 mg total) by mouth every 8 (eight) hours as needed for nausea or vomiting. 20 tablet Orlean Holtrop K, PA-C      PDMP not reviewed this encounter.   Sherrell Rocky POUR, PA-C 04/21/23 1858

## 2023-04-21 NOTE — Discharge Instructions (Signed)
 I believe the you have a stomach bug.  Use Zofran  every 8 hours as needed for nausea and vomiting symptoms.  Eat a bland diet such as the brat diet (bananas, rice, applesauce, toast).  I recommend over-the-counter medications to help including Pepto-Bismol.  Make sure that you are drinking plenty of fluid such as Pedialyte to ensure that you are not getting dehydrated.  If your symptoms are not improving within a few days please return for reevaluation.  If anything worsens and you have severe abdominal pain, more than 10 bowel movements per 24 hours, fever, blood in your stool, nausea/vomiting interfering with oral intake, nausea/vomiting despite the medication you need to be seen immediately.

## 2023-04-21 NOTE — ED Triage Notes (Signed)
 Patient reports nausea, vomiting, diarrhea since Monday.  Randomly, some days better than others.  Patient has been around another family member with similar symptoms over christmas.  Patient complains of body aches, back pain,  no vomiting today, but continues to have diarrhea.

## 2023-10-12 ENCOUNTER — Other Ambulatory Visit (HOSPITAL_COMMUNITY)
Admission: RE | Admit: 2023-10-12 | Discharge: 2023-10-12 | Disposition: A | Discharge status: Home / Self Care | Source: Ambulatory Visit | Attending: Obstetrics & Gynecology | Admitting: Obstetrics & Gynecology

## 2023-10-12 ENCOUNTER — Ambulatory Visit (INDEPENDENT_AMBULATORY_CARE_PROVIDER_SITE_OTHER): Admitting: Obstetrics & Gynecology

## 2023-10-12 ENCOUNTER — Encounter: Payer: Self-pay | Admitting: Obstetrics & Gynecology

## 2023-10-12 VITALS — BP 118/84 | HR 78 | Ht 61.0 in | Wt 222.4 lb

## 2023-10-12 DIAGNOSIS — Z3009 Encounter for other general counseling and advice on contraception: Secondary | ICD-10-CM

## 2023-10-12 DIAGNOSIS — Z0000 Encounter for general adult medical examination without abnormal findings: Secondary | ICD-10-CM | POA: Insufficient documentation

## 2023-10-12 DIAGNOSIS — Z01419 Encounter for gynecological examination (general) (routine) without abnormal findings: Secondary | ICD-10-CM

## 2023-10-12 DIAGNOSIS — Z113 Encounter for screening for infections with a predominantly sexual mode of transmission: Secondary | ICD-10-CM

## 2023-10-12 NOTE — Progress Notes (Signed)
 WELL-WOMAN EXAMINATION Patient name: Audrey Burch MRN [id number]  Date of birth: 12/12/1995 Chief Complaint:   Gynecologic Exam (Has fertility questions)  History of Present Illness:   Audrey Burch is a 28 y.o. G0P0000  female being seen today for a routine well-woman exam.   Menses  were irregular for a very long time and now with current dose of synthroid  they are every month. Denies HMB or dysmenorrhea.  Menses starting becoming regular about a year ago.  Not using protection for the past 5 yrs and strongly desires a pregnancy  Denies vaginal discharge, itching or irritation.  Denies  Patient's last menstrual period was 09/21/2023 (approximate).  The current method of family planning is desires pregnancy.    Last pap collected today.  Last mammogram: NA. Last colonoscopy: NA     10/12/2023    9:04 AM 01/04/2019   10:22 AM  Depression screen PHQ 2/9  Decreased Interest 1 3  Down, Depressed, Hopeless 1 1  PHQ - 2 Score 2 4  Altered sleeping 2 3  Tired, decreased energy 1 3  Change in appetite 1 3  Feeling bad or failure about yourself  0 0  Trouble concentrating 2 1  Moving slowly or fidgety/restless 0 1  Suicidal thoughts 0 0  PHQ-9 Score 8 15      Review of Systems:   Pertinent items are noted in HPI Denies any headaches, blurred vision, fatigue, shortness of breath, chest pain, abdominal pain, bowel movements, urination, or intercourse unless otherwise stated above.  Pertinent History Reviewed:  Reviewed past medical,surgical, social and family history.  Reviewed problem list, medications and allergies. Physical Assessment:   Vitals:   10/12/23 0908  BP: 118/84  Pulse: 78  Weight: 222 lb 6.4 oz (100.9 kg)  Height: 5' 1 (1.549 m)  Body mass index is 42.02 kg/m.        Physical Examination:   General appearance - well appearing, and in no distress  Mental status - alert, oriented to person, place, and time  Psych:  She has a  normal mood and affect  Skin - warm and dry, normal color, no suspicious lesions noted  Chest - effort normal, all lung fields clear to auscultation bilaterally  Heart - normal rate and regular rhythm  Neck:  midline trachea, no thyromegaly or nodules  Breasts - breasts appear normal, no suspicious masses, no skin or nipple changes or  axillary nodes  Abdomen - soft, nontender, nondistended, no masses or organomegaly  Pelvic - VULVA: normal appearing vulva with no masses, tenderness or lesions  VAGINA: normal appearing vagina with normal color and discharge, no lesions  CERVIX: normal appearing cervix without discharge or lesions, no CMT  Thin prep pap is done with HR HPV cotesting  UTERUS: uterus is felt to be normal size, shape, consistency and nontender   ADNEXA: No adnexal masses or tenderness noted.  Extremities:  No swelling or varicosities noted  Chaperone: Clarita Salt     Assessment & Plan:  1) Well-Woman Exam -pap collected, reviewed screening guidelines -STI screening to be completed  2) Family planning - Based on clinical history, suspect that she was not ovulating for the last several years. - Discussed fecundity and tracking of ovulation - Advised patient to track ovulatory window and complete LH predictor kits, follow-up in 6 months if pregnancy has not been achieved  Orders Placed This Encounter  Procedures   HIV Antibody (routine testing w rflx)   RPR  Meds: No orders of the defined types were placed in this encounter.   Follow-up: Return in about 1 year (around 10/11/2024) for Annual.   Hafsah Hendler, DO Attending Obstetrician & Gynecologist, [facility] for [facility], [facility]

## 2023-10-13 ENCOUNTER — Ambulatory Visit: Payer: Self-pay | Admitting: Obstetrics & Gynecology

## 2023-10-13 LAB — RPR: RPR Ser Ql: NONREACTIVE

## 2023-10-13 LAB — HIV ANTIBODY (ROUTINE TESTING W REFLEX): HIV Screen 4th Generation wRfx: NONREACTIVE

## 2023-10-19 LAB — CYTOLOGY - PAP
Chlamydia: NEGATIVE
Comment: NEGATIVE
Comment: NEGATIVE
Comment: NEGATIVE
Comment: NORMAL
Diagnosis: NEGATIVE
Diagnosis: REACTIVE
High risk HPV: NEGATIVE
Neisseria Gonorrhea: NEGATIVE
Trichomonas: NEGATIVE

## 2023-11-06 ENCOUNTER — Telehealth: Admitting: Nurse Practitioner

## 2023-11-06 DIAGNOSIS — E063 Autoimmune thyroiditis: Secondary | ICD-10-CM | POA: Diagnosis not present

## 2023-11-06 DIAGNOSIS — E890 Postprocedural hypothyroidism: Secondary | ICD-10-CM | POA: Diagnosis not present

## 2023-11-06 MED ORDER — LEVOTHYROXINE SODIUM 150 MCG PO TABS: 150.0000 ug | ORAL_TABLET | Freq: Every day | ORAL | 0 refills | Status: AC

## 2023-11-06 NOTE — Patient Instructions (Signed)
  Audrey Burch, thank you for joining Haze LELON Servant, NP for today's virtual visit.  While this provider is not your primary care provider (PCP), if your PCP is located in our provider database this encounter information will be shared with them immediately following your visit.   A [facility] MyChart account gives you access to today's visit and all your visits, tests, and labs performed at [facility]  click here if you don't have a [facility] MyChart account or go to [email or web address]  Consent: (Patient) Audrey Burch provided verbal consent for this virtual visit at the beginning of the encounter.  Current Medications:  Current Outpatient Medications:    albuterol (VENTOLIN HFA) 108 (90 Base) MCG/ACT inhaler, Inhale 1 puff into the lungs every 6 (six) hours as needed for wheezing., Disp: , Rfl:    Cyanocobalamin (VITAMIN B-12 PO), Take 1,000 mcg by mouth., Disp: , Rfl:    ferrous sulfate 325 (65 FE) MG EC tablet, Take 325 mg by mouth 3 (three) times daily with meals., Disp: , Rfl:    FOLIC ACID PO, Take by mouth., Disp: , Rfl:    levothyroxine  (SYNTHROID ) 150 MCG tablet, Take 1 tablet (150 mcg total) by mouth daily before breakfast., Disp: 30 tablet, Rfl: 0   metFORMIN (GLUCOPHAGE) 500 MG tablet, Take 500 mg by mouth 2 (two) times daily with a meal., Disp: , Rfl:    phentermine 37.5 MG capsule, Take 37.5 mg by mouth every morning., Disp: , Rfl:    Medications ordered in this encounter:  Meds ordered this encounter  Medications   levothyroxine  (SYNTHROID ) 150 MCG tablet    Sig: Take 1 tablet (150 mcg total) by mouth daily before breakfast.    Dispense:  30 tablet    Refill:  0    Supervising Provider:   BLAISE ALEENE KIDD [[id number]]     *If you need refills on other medications prior to your next appointment, please contact your pharmacy*  Follow-Up: Call back or seek an in-person evaluation if the symptoms worsen or if the condition fails to  improve as anticipated.  [facility] [phone]  Other Instructions F/U with PCP for additional refills   If you have been instructed to have an in-person evaluation today at a local Urgent Care facility, please use the link below. It will take you to a list of all of our available [facility], including address, phone number and hours of operation. Please do not delay care.  [facility]  If you or a family member do not have a primary care provider, use the link below to schedule a visit and establish care. When you choose a [facility] primary care physician or advanced practice provider, you gain a long-term partner in health. Find a Primary Care Provider  Learn more about [facility] in-office and virtual care options: [facility] - Get Care Now

## 2023-11-06 NOTE — Progress Notes (Signed)
 Virtual Visit Consent   Audrey Burch, you are scheduled for a virtual visit with a [facility] provider today. Just as with appointments in the office, your consent must be obtained to participate. Your consent will be active for this visit and any virtual visit you may have with one of our providers in the next 365 days. If you have a MyChart account, a copy of this consent can be sent to you electronically.  As this is a virtual visit, video technology does not allow for your provider to perform a traditional examination. This may limit your provider's ability to fully assess your condition. If your provider identifies any concerns that need to be evaluated in person or the need to arrange testing (such as labs, EKG, etc.), we will make arrangements to do so. Although advances in technology are sophisticated, we cannot ensure that it will always work on either your end or our end. If the connection with a video visit is poor, the visit may have to be switched to a telephone visit. With either a video or telephone visit, we are not always able to ensure that we have a secure connection.  By engaging in this virtual visit, you consent to the provision of healthcare and authorize for your insurance to be billed (if applicable) for the services provided during this visit. Depending on your insurance coverage, you may receive a charge related to this service.  I need to obtain your verbal consent now. Are you willing to proceed with your visit today? KARILYN WIND has provided verbal consent on 11/06/2023 for a virtual visit (video or telephone). Haze LELON Servant, NP  Date: 11/06/2023 27:54 PM   Virtual Visit via Video Note   I, Haze LELON Servant, connected with  [name]  ([id number], [birth year]) on 11/06/23 at 12:45 PM EDT by a video-enabled telemedicine application and verified that I am speaking with the correct person using two identifiers.  Location: Patient: Virtual  Visit Location Patient: Home Provider: Virtual Visit Location Provider: Home Office   I discussed the limitations of evaluation and management by telemedicine and the availability of in person appointments. The patient expressed understanding and agreed to proceed.    History of Present Illness: Audrey Burch is a 28 y.o. who identifies as a female who was assigned female at birth, and is being seen today for Hypothyroidism. .  Ms Audrey Burch has run out of her refills of levothyroxine  150mg . She has been out for 4 days and requesting an emergency prescription be refillef for her today. I was able to verify with [facility] that she did pick up a 90 day supply on 07-29-2023.   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   Hypocalcemia 03/20/2015   Thyroiditis, autoimmune 07/10/2014   Insulin resistance 07/10/2014   Acanthosis nigricans, acquired 07/10/2014   Dyspepsia 07/10/2014    Allergies:  Allergies  Allergen Reactions   Vancomycin  Hives   Medications:  Current Outpatient Medications:    albuterol (VENTOLIN HFA) 108 (90 Base) MCG/ACT inhaler, Inhale 1 puff into the lungs every 6 (six) hours as needed for wheezing., Disp: , Rfl:    Cyanocobalamin (VITAMIN B-12 PO), Take 1,000 mcg by mouth., Disp: , Rfl:    ferrous sulfate 325 (65 FE) MG EC tablet, Take 325 mg by mouth 3 (three) times daily with meals., Disp: , Rfl:  FOLIC ACID PO, Take by mouth., Disp: , Rfl:    levothyroxine  (SYNTHROID ) 150 MCG tablet, Take 1 tablet (150 mcg total) by mouth daily before breakfast., Disp: 30 tablet, Rfl: 0   metFORMIN (GLUCOPHAGE) 500 MG tablet, Take 500 mg by mouth 2 (two) times daily with a meal., Disp: , Rfl:    phentermine 37.5 MG capsule, Take 37.5 mg by mouth every morning., Disp: , Rfl:   Observations/Objective: Patient is well-developed, well-nourished in no  acute distress.  Resting comfortably at home.  Head is normocephalic, atraumatic.  No labored breathing. Speech is clear and coherent with logical content.  Patient is alert and oriented at baseline.    Assessment and Plan: 1. Thyroiditis, autoimmune (Primary) - levothyroxine  (SYNTHROID ) 150 MCG tablet; Take 1 tablet (150 mcg total) by mouth daily before breakfast.  Dispense: 30 tablet; Refill: 0  2. Post-surgical hypothyroidism - levothyroxine  (SYNTHROID ) 150 MCG tablet; Take 1 tablet (150 mcg total) by mouth daily before breakfast.  Dispense: 30 tablet; Refill: 0  F/U with PCP for additional refills  Follow Up Instructions: I discussed the assessment and treatment plan with the patient. The patient was provided an opportunity to ask questions and all were answered. The patient agreed with the plan and demonstrated an understanding of the instructions.  A copy of instructions were sent to the patient via MyChart unless otherwise noted below.    The patient was advised to call back or seek an in-person evaluation if the symptoms worsen or if the condition fails to improve as anticipated.    Saia Derossett W Darthy Manganelli, NP

## 2023-12-03 ENCOUNTER — Other Ambulatory Visit: Payer: Self-pay | Admitting: Nurse Practitioner

## 2023-12-03 DIAGNOSIS — E063 Autoimmune thyroiditis: Secondary | ICD-10-CM

## 2023-12-03 DIAGNOSIS — E890 Postprocedural hypothyroidism: Secondary | ICD-10-CM

## 2024-05-22 ENCOUNTER — Ambulatory Visit (HOSPITAL_COMMUNITY)
Admission: EM | Admit: 2024-05-22 | Discharge: 2024-05-22 | Disposition: A | Discharge status: Home / Self Care | Source: Home / Self Care | Attending: Family Medicine | Admitting: Family Medicine

## 2024-05-22 ENCOUNTER — Encounter (HOSPITAL_COMMUNITY): Payer: Self-pay | Admitting: *Deleted

## 2024-05-22 ENCOUNTER — Other Ambulatory Visit: Payer: Self-pay

## 2024-05-22 DIAGNOSIS — Z3201 Encounter for pregnancy test, result positive: Secondary | ICD-10-CM | POA: Diagnosis not present

## 2024-05-22 DIAGNOSIS — O219 Vomiting of pregnancy, unspecified: Secondary | ICD-10-CM

## 2024-05-22 LAB — POCT URINALYSIS DIP (MANUAL ENTRY)
Bilirubin, UA: NEGATIVE
Blood, UA: NEGATIVE
Glucose, UA: NEGATIVE mg/dL
Ketones, POC UA: NEGATIVE mg/dL
Nitrite, UA: NEGATIVE
Protein Ur, POC: NEGATIVE mg/dL
Spec Grav, UA: 1.01
Urobilinogen, UA: 0.2 U/dL
pH, UA: 7

## 2024-05-22 LAB — POCT URINE PREGNANCY: Preg Test, Ur: POSITIVE — AB

## 2024-05-22 NOTE — ED Triage Notes (Addendum)
 PT reports nausea,diarrhea,vomiting and tender nipples. Pt reports she has positive pregnancy test at home but has had a false positive in the past. LMPP 04-08-2024.

## 2024-05-22 NOTE — Discharge Instructions (Signed)
 Your pregnancy test was positive.  Urinalysis had a trace of white blood cells, which can be normal.  Continue taking your levothyroxine  and your folic acid as prescribed.  Check your sugar regularly, and reduce the dose of your metformin to once daily or none daily if your sugars are going lower and/or you are having a difficult time taking in much calories.  Start eating little bits of starchy things before you get up out of bed in the morning and try to eat something every 20 to 30 minutes to keep your stomach from being empty.  Make sure you are getting plenty of fluids then.  Do not take your iron that is prescribed for now until you see OB/GYN and they tell you if you need to take extra iron.  Do take the prenatal vitamin with iron daily  If the diarrhea is a lot, you can take Imodium  Tylenol  is safe to take in pregnancy

## 2024-06-05 ENCOUNTER — Other Ambulatory Visit
